# Patient Record
Sex: Female | Born: 1960 | Race: White | Hispanic: No | Marital: Married | Smoking: Never smoker
Health system: Southern US, Community
[De-identification: ages and names within clinical notes are randomized; demographics above are authoritative.]

---

## 2001-07-08 ENCOUNTER — Other Ambulatory Visit: Admission: RE | Admit: 2001-07-08 | Discharge: 2001-07-08 | Payer: Self-pay | Admitting: Obstetrics and Gynecology

## 2002-07-10 ENCOUNTER — Other Ambulatory Visit: Admission: RE | Admit: 2002-07-10 | Discharge: 2002-07-10 | Payer: Self-pay | Admitting: Obstetrics and Gynecology

## 2003-08-01 ENCOUNTER — Other Ambulatory Visit: Admission: RE | Admit: 2003-08-01 | Discharge: 2003-08-01 | Payer: Self-pay | Admitting: Obstetrics and Gynecology

## 2006-11-09 ENCOUNTER — Ambulatory Visit (HOSPITAL_COMMUNITY): Admission: RE | Admit: 2006-11-09 | Discharge: 2006-11-09 | Payer: Self-pay | Admitting: Obstetrics and Gynecology

## 2010-10-14 ENCOUNTER — Other Ambulatory Visit: Payer: Self-pay | Admitting: Obstetrics and Gynecology

## 2010-12-03 ENCOUNTER — Other Ambulatory Visit: Payer: Self-pay | Admitting: Obstetrics and Gynecology

## 2020-03-02 ENCOUNTER — Other Ambulatory Visit: Payer: Self-pay

## 2020-03-02 ENCOUNTER — Emergency Department (HOSPITAL_COMMUNITY): Payer: 59

## 2020-03-02 ENCOUNTER — Encounter (HOSPITAL_COMMUNITY): Payer: Self-pay

## 2020-03-02 DIAGNOSIS — A419 Sepsis, unspecified organism: Secondary | ICD-10-CM | POA: Diagnosis not present

## 2020-03-02 DIAGNOSIS — E871 Hypo-osmolality and hyponatremia: Secondary | ICD-10-CM | POA: Diagnosis present

## 2020-03-02 DIAGNOSIS — K5732 Diverticulitis of large intestine without perforation or abscess without bleeding: Secondary | ICD-10-CM | POA: Diagnosis present

## 2020-03-02 DIAGNOSIS — K8020 Calculus of gallbladder without cholecystitis without obstruction: Secondary | ICD-10-CM | POA: Diagnosis present

## 2020-03-02 DIAGNOSIS — K2100 Gastro-esophageal reflux disease with esophagitis, without bleeding: Secondary | ICD-10-CM | POA: Diagnosis present

## 2020-03-02 DIAGNOSIS — Z79899 Other long term (current) drug therapy: Secondary | ICD-10-CM

## 2020-03-02 DIAGNOSIS — J9811 Atelectasis: Secondary | ICD-10-CM | POA: Diagnosis present

## 2020-03-02 DIAGNOSIS — Z20822 Contact with and (suspected) exposure to covid-19: Secondary | ICD-10-CM | POA: Diagnosis present

## 2020-03-02 DIAGNOSIS — R630 Anorexia: Secondary | ICD-10-CM | POA: Diagnosis present

## 2020-03-02 LAB — CBC
HCT: 39 % (ref 36.0–46.0)
Hemoglobin: 12.7 g/dL (ref 12.0–15.0)
MCH: 28.3 pg (ref 26.0–34.0)
MCHC: 32.6 g/dL (ref 30.0–36.0)
MCV: 87.1 fL (ref 80.0–100.0)
Platelets: 256 10*3/uL (ref 150–400)
RBC: 4.48 MIL/uL (ref 3.87–5.11)
RDW: 12.8 % (ref 11.5–15.5)
WBC: 17.9 10*3/uL — ABNORMAL HIGH (ref 4.0–10.5)
nRBC: 0 % (ref 0.0–0.2)

## 2020-03-02 LAB — BASIC METABOLIC PANEL
Anion gap: 9 (ref 5–15)
BUN: 16 mg/dL (ref 6–20)
CO2: 23 mmol/L (ref 22–32)
Calcium: 9.2 mg/dL (ref 8.9–10.3)
Chloride: 102 mmol/L (ref 98–111)
Creatinine, Ser: 0.78 mg/dL (ref 0.44–1.00)
GFR calc Af Amer: >60 mL/min (ref >60)
GFR calc non Af Amer: >60 mL/min (ref >60)
Glucose, Bld: 121 mg/dL — ABNORMAL HIGH (ref 70–99)
Potassium: 4 mmol/L (ref 3.5–5.1)
Sodium: 134 mmol/L — ABNORMAL LOW (ref 135–145)

## 2020-03-02 LAB — I-STAT BETA HCG BLOOD, ED (MC, WL, AP ONLY): I-stat hCG, quantitative: 22.6 m[IU]/mL — ABNORMAL HIGH (ref <5)

## 2020-03-02 NOTE — ED Triage Notes (Signed)
Pt reports R sided flank pain that started today. Pt states she had generalized abdominal pain on Wednesday and went to UC where they ruled out gall stones and any other issues.

## 2020-03-03 ENCOUNTER — Inpatient Hospital Stay (HOSPITAL_COMMUNITY): Payer: 59

## 2020-03-03 ENCOUNTER — Encounter (HOSPITAL_COMMUNITY): Payer: Self-pay | Admitting: Family Medicine

## 2020-03-03 ENCOUNTER — Inpatient Hospital Stay (HOSPITAL_COMMUNITY)
Admission: EM | Admit: 2020-03-03 | Discharge: 2020-03-05 | DRG: 872 | Disposition: A | Discharge status: Home / Self Care | Payer: 59 | Attending: Internal Medicine | Admitting: Internal Medicine

## 2020-03-03 DIAGNOSIS — E871 Hypo-osmolality and hyponatremia: Secondary | ICD-10-CM | POA: Diagnosis present

## 2020-03-03 DIAGNOSIS — K8020 Calculus of gallbladder without cholecystitis without obstruction: Secondary | ICD-10-CM | POA: Diagnosis present

## 2020-03-03 DIAGNOSIS — K2100 Gastro-esophageal reflux disease with esophagitis, without bleeding: Secondary | ICD-10-CM | POA: Diagnosis present

## 2020-03-03 DIAGNOSIS — J9811 Atelectasis: Secondary | ICD-10-CM | POA: Diagnosis present

## 2020-03-03 DIAGNOSIS — R935 Abnormal findings on diagnostic imaging of other abdominal regions, including retroperitoneum: Secondary | ICD-10-CM

## 2020-03-03 DIAGNOSIS — A419 Sepsis, unspecified organism: Secondary | ICD-10-CM | POA: Diagnosis present

## 2020-03-03 DIAGNOSIS — K5792 Diverticulitis of intestine, part unspecified, without perforation or abscess without bleeding: Secondary | ICD-10-CM | POA: Diagnosis present

## 2020-03-03 DIAGNOSIS — Z20822 Contact with and (suspected) exposure to covid-19: Secondary | ICD-10-CM | POA: Diagnosis present

## 2020-03-03 DIAGNOSIS — K5732 Diverticulitis of large intestine without perforation or abscess without bleeding: Secondary | ICD-10-CM

## 2020-03-03 DIAGNOSIS — Z79899 Other long term (current) drug therapy: Secondary | ICD-10-CM | POA: Diagnosis not present

## 2020-03-03 DIAGNOSIS — R630 Anorexia: Secondary | ICD-10-CM | POA: Diagnosis present

## 2020-03-03 DIAGNOSIS — R1011 Right upper quadrant pain: Secondary | ICD-10-CM

## 2020-03-03 LAB — HIV ANTIBODY (ROUTINE TESTING W REFLEX): HIV Screen 4th Generation wRfx: NONREACTIVE

## 2020-03-03 LAB — URINALYSIS, ROUTINE W REFLEX MICROSCOPIC
Bilirubin Urine: NEGATIVE
Glucose, UA: NEGATIVE mg/dL
Ketones, ur: 5 mg/dL — AB
Nitrite: NEGATIVE
Protein, ur: NEGATIVE mg/dL
Specific Gravity, Urine: 1.028 (ref 1.005–1.030)
pH: 5 (ref 5.0–8.0)

## 2020-03-03 LAB — CBC
HCT: 36.8 % (ref 36.0–46.0)
Hemoglobin: 12.1 g/dL (ref 12.0–15.0)
MCH: 28.9 pg (ref 26.0–34.0)
MCHC: 32.9 g/dL (ref 30.0–36.0)
MCV: 87.8 fL (ref 80.0–100.0)
Platelets: 254 10*3/uL (ref 150–400)
RBC: 4.19 MIL/uL (ref 3.87–5.11)
RDW: 13.1 % (ref 11.5–15.5)
WBC: 18 10*3/uL — ABNORMAL HIGH (ref 4.0–10.5)
nRBC: 0 % (ref 0.0–0.2)

## 2020-03-03 LAB — HEPATIC FUNCTION PANEL
ALT: 22 U/L (ref 0–44)
AST: 19 U/L (ref 15–41)
Albumin: 4 g/dL (ref 3.5–5.0)
Alkaline Phosphatase: 91 U/L (ref 38–126)
Bilirubin, Direct: 0.4 mg/dL — ABNORMAL HIGH (ref 0.0–0.2)
Indirect Bilirubin: 0.9 mg/dL (ref 0.3–0.9)
Total Bilirubin: 1.3 mg/dL — ABNORMAL HIGH (ref 0.3–1.2)
Total Protein: 7.9 g/dL (ref 6.5–8.1)

## 2020-03-03 LAB — LIPASE, BLOOD: Lipase: 26 U/L (ref 11–51)

## 2020-03-03 LAB — HCG, QUANTITATIVE, PREGNANCY: hCG, Beta Chain, Quant, S: 4 m[IU]/mL (ref <5)

## 2020-03-03 LAB — MAGNESIUM: Magnesium: 1.7 mg/dL (ref 1.7–2.4)

## 2020-03-03 LAB — SARS CORONAVIRUS 2 BY RT PCR (HOSPITAL ORDER, PERFORMED IN ~~LOC~~ HOSPITAL LAB): SARS Coronavirus 2: NEGATIVE

## 2020-03-03 LAB — LACTIC ACID, PLASMA: Lactic Acid, Venous: 0.7 mmol/L (ref 0.5–1.9)

## 2020-03-03 MED ORDER — ENOXAPARIN SODIUM 40 MG/0.4ML ~~LOC~~ SOLN
40.0000 mg | SUBCUTANEOUS | Status: DC
  Administered 2020-03-03 – 2020-03-04 (×2): 40 mg via SUBCUTANEOUS
  Filled 2020-03-03 (×2): qty 0.4

## 2020-03-03 MED ORDER — METRONIDAZOLE IN NACL 5-0.79 MG/ML-% IV SOLN
500.0000 mg | Freq: Three times a day (TID) | INTRAVENOUS | Status: DC
  Administered 2020-03-03 – 2020-03-05 (×7): 500 mg via INTRAVENOUS
  Filled 2020-03-03 (×7): qty 100

## 2020-03-03 MED ORDER — ACETAMINOPHEN 325 MG PO TABS
650.0000 mg | ORAL_TABLET | Freq: Four times a day (QID) | ORAL | Status: DC | PRN
  Administered 2020-03-03 – 2020-03-04 (×3): 650 mg via ORAL
  Filled 2020-03-03 (×3): qty 2

## 2020-03-03 MED ORDER — HYDROCODONE-ACETAMINOPHEN 5-325 MG PO TABS
1.0000 | ORAL_TABLET | ORAL | Status: DC | PRN
  Administered 2020-03-03: 1 via ORAL
  Administered 2020-03-03: 16:00:00 2 via ORAL
  Filled 2020-03-03: qty 2
  Filled 2020-03-03: qty 1
  Filled 2020-03-03: qty 2

## 2020-03-03 MED ORDER — SODIUM CHLORIDE 0.9 % IV SOLN: INTRAVENOUS | Status: AC

## 2020-03-03 MED ORDER — LORATADINE 10 MG PO TABS
10.0000 mg | ORAL_TABLET | Freq: Every day | ORAL | Status: DC
  Administered 2020-03-03 – 2020-03-05 (×3): 10 mg via ORAL
  Filled 2020-03-03 (×3): qty 1

## 2020-03-03 MED ORDER — PANTOPRAZOLE SODIUM 40 MG PO TBEC
40.0000 mg | DELAYED_RELEASE_TABLET | Freq: Two times a day (BID) | ORAL | Status: DC
  Administered 2020-03-03 – 2020-03-05 (×5): 40 mg via ORAL
  Filled 2020-03-03 (×5): qty 1

## 2020-03-03 MED ORDER — ACETAMINOPHEN 650 MG RE SUPP: 650.0000 mg | Freq: Four times a day (QID) | RECTAL | Status: DC | PRN

## 2020-03-03 MED ORDER — HYDROMORPHONE HCL 1 MG/ML IJ SOLN
0.5000 mg | Freq: Once | INTRAMUSCULAR | Status: AC
  Administered 2020-03-03: 0.5 mg via INTRAVENOUS
  Filled 2020-03-03: qty 1

## 2020-03-03 MED ORDER — MORPHINE SULFATE (PF) 4 MG/ML IV SOLN
3.0000 mg | INTRAVENOUS | Status: DC | PRN
  Administered 2020-03-03: 3 mg via INTRAVENOUS
  Filled 2020-03-03: qty 1

## 2020-03-03 MED ORDER — ONDANSETRON HCL 4 MG/2ML IJ SOLN: 4.0000 mg | Freq: Four times a day (QID) | INTRAMUSCULAR | Status: DC | PRN

## 2020-03-03 MED ORDER — OXYCODONE HCL 5 MG PO TABS
5.0000 mg | ORAL_TABLET | ORAL | Status: DC | PRN
  Administered 2020-03-04: 5 mg via ORAL
  Filled 2020-03-03 (×2): qty 1

## 2020-03-03 MED ORDER — LACTATED RINGERS IV BOLUS
1000.0000 mL | Freq: Once | INTRAVENOUS | Status: AC
  Administered 2020-03-03: 1000 mL via INTRAVENOUS

## 2020-03-03 MED ORDER — SODIUM CHLORIDE 0.9 % IV SOLN
2.0000 g | Freq: Every day | INTRAVENOUS | Status: DC
  Administered 2020-03-03 – 2020-03-05 (×3): 2 g via INTRAVENOUS
  Filled 2020-03-03 (×2): qty 2

## 2020-03-03 MED ORDER — LORATADINE-PSEUDOEPHEDRINE ER 10-240 MG PO TB24: 1.0000 | ORAL_TABLET | Freq: Every day | ORAL | Status: DC

## 2020-03-03 MED ORDER — PSEUDOEPHEDRINE HCL ER 120 MG PO TB12
120.0000 mg | ORAL_TABLET | Freq: Two times a day (BID) | ORAL | Status: DC
  Administered 2020-03-03 – 2020-03-05 (×5): 120 mg via ORAL
  Filled 2020-03-03 (×5): qty 1

## 2020-03-03 MED ORDER — ONDANSETRON HCL 4 MG/2ML IJ SOLN
4.0000 mg | Freq: Once | INTRAMUSCULAR | Status: AC
  Administered 2020-03-03: 4 mg via INTRAVENOUS
  Filled 2020-03-03: qty 2

## 2020-03-03 MED ORDER — METRONIDAZOLE IN NACL 5-0.79 MG/ML-% IV SOLN
500.0000 mg | Freq: Once | INTRAVENOUS | Status: DC
  Filled 2020-03-03: qty 100

## 2020-03-03 MED ORDER — CIPROFLOXACIN IN D5W 400 MG/200ML IV SOLN
400.0000 mg | Freq: Once | INTRAVENOUS | Status: DC
  Filled 2020-03-03: qty 200

## 2020-03-03 MED ORDER — ONDANSETRON HCL 4 MG PO TABS: 4.0000 mg | ORAL_TABLET | Freq: Four times a day (QID) | ORAL | Status: DC | PRN

## 2020-03-03 NOTE — H&P (Signed)
History and Physical    Denise Lloyd ZOX:[id number] DOB: June 26, 1961 DOA: 03/03/2020  PCP: Patient, No Pcp Per   Patient coming from: Home   Chief Complaint: Abdominal pain, loss of appetite   HPI: Denise Lloyd is a 58 y.o. female with medical history significant for GERD with esophagitis, presenting to the emergency department with abdominal pain and loss of appetite.  Patient reports that she developed generalized abdominal discomfort couple days ago, was seen at an urgent care where she reports having unremarkable urinalysis, was told that her blood work looked okay and that she did not have any gallstones, and went home with Norco, ciprofloxacin, and Flagyl.  She went on to have more severe pain which was localizing towards the right side of her abdomen, becoming severe, not controlled with Norco, and ultimately prompting her presentation to the ED.  She has not noticed any fevers or chills, reports loss of appetite but denies vomiting, denies diarrhea, and denies melena or hematochezia.  She has never experienced this before.  She has tried to eat a little bit while taking her antibiotics at home and has not noticed any worsening in her symptoms after eating.  She denies any shortness of breath or cough.  She denies chest pain.  ED Course: Upon arrival to the ED, patient is found to be afebrile, saturating 90s on room air, heart rate 100, and blood pressure 130/90.  Chemistry panel is unremarkable and CBC notable for leukocytosis to 17,900.  CT of the abdomen and pelvis is concerning for acute diverticulitis at the hepatic flexure without fluid collection or free air.  Also noted on the CT is some gallbladder wall thickening and atelectasis versus early infection at the right base.  Patient was given a liter of IV fluids, Dilaudid, and started on antibiotics in the ED.  LFTs, lipase, urinalysis, and Covid screening test are all pending at this time.  Review of Systems:  All other systems reviewed  and apart from HPI, are negative.  History reviewed. No pertinent past medical history.  History reviewed. No pertinent surgical history.  Social History:   has no history on file for tobacco use, alcohol use, and drug use.  No Known Allergies  History reviewed. No pertinent family history.   Prior to Admission medications   Medication Sig Start Date End Date Taking? Authorizing Provider  ciprofloxacin (CIPRO) 500 MG tablet Take 500 mg by mouth 2 (two) times daily. 03/01/20  Yes [provider]  glucosamine-chondroitin 500-400 MG tablet Take 2 tablets by mouth daily.    Yes [provider]  HYDROcodone-acetaminophen (NORCO/VICODIN) 5-325 MG tablet Take 1 tablet by mouth every 4 (four) hours as needed for moderate pain or severe pain.  02/29/20  Yes [provider]  ibuprofen (ADVIL) 200 MG tablet Take 600 mg by mouth every 6 (six) hours as needed for fever, headache, mild pain, moderate pain or cramping.   Yes [provider]  loratadine-pseudoephedrine (CLARITIN-D 24-HOUR) 10-240 MG 24 hr tablet Take 1 tablet by mouth daily.   Yes [provider]  metroNIDAZOLE (FLAGYL) 500 MG tablet Take 500 mg by mouth 3 (three) times daily. 03/01/20  Yes [provider]  Multiple Vitamin (MULTIVITAMIN WITH MINERALS) TABS tablet Take 1 tablet by mouth daily.   Yes [provider]  omeprazole (PRILOSEC) 40 MG capsule Take 40 mg by mouth 2 (two) times daily. 02/16/20  Yes [provider]  sodium chloride (MURO 128) 5 % ophthalmic ointment Place 1  application into both eyes at bedtime.   Yes [provider]    Physical Exam: Vitals:   03/02/20 2100 03/03/20 0307  BP: 129/88 132/88  Pulse: 100 96  Resp: 20 (!) 22  Temp: 97.9 F (36.6 C)   TempSrc: Oral   SpO2: 91% 100%    Constitutional: NAD, calm  Eyes: PERTLA, lids and conjunctivae normal ENMT: Mucous membranes are moist. Posterior pharynx clear of any exudate or  lesions.   Neck: normal, supple, no masses, no thyromegaly Respiratory:  no wheezing, no crackles. No accessory muscle use.  Cardiovascular: S1 & S2 heard, regular rate and rhythm. No extremity edema.   Abdomen: No distension, tender in right abdomen, no rebound pain or guarding. Bowel sounds active.  Musculoskeletal: no clubbing / cyanosis. No joint deformity upper and lower extremities.   Skin: no significant rashes, lesions, ulcers. Warm, dry, well-perfused. Neurologic: CN 2-12 grossly intact. Sensation intact. Moving all extremities.  Psychiatric: Alert and oriented to person, place, and situation. Very pleasant and cooperative.    Labs and Imaging on Admission: I have personally reviewed following labs and imaging studies  CBC: Recent Labs  Lab 03/02/20 2122  WBC 17.9*  HGB 12.7  HCT 39.0  MCV 87.1  PLT 256   Basic Metabolic Panel: Recent Labs  Lab 03/02/20 2122  NA 134*  K 4.0  CL 102  CO2 23  GLUCOSE 121*  BUN 16  CREATININE 0.78  CALCIUM 9.2   GFR: CrCl cannot be calculated (Unknown ideal weight.). Liver Function Tests: No results for input(s): AST, ALT, ALKPHOS, BILITOT, PROT, ALBUMIN in the last 168 hours. No results for input(s): LIPASE, AMYLASE in the last 168 hours. No results for input(s): AMMONIA in the last 168 hours. Coagulation Profile: No results for input(s): INR, PROTIME in the last 168 hours. Cardiac Enzymes: No results for input(s): CKTOTAL, CKMB, CKMBINDEX, TROPONINI in the last 168 hours. BNP (last 3 results) No results for input(s): PROBNP in the last 8760 hours. HbA1C: No results for input(s): HGBA1C in the last 72 hours. CBG: No results for input(s): GLUCAP in the last 168 hours. Lipid Profile: No results for input(s): CHOL, HDL, LDLCALC, TRIG, CHOLHDL, LDLDIRECT in the last 72 hours. Thyroid Function Tests: No results for input(s): TSH, T4TOTAL, FREET4, T3FREE, THYROIDAB in the last 72 hours. Anemia Panel: No results for input(s):  VITAMINB12, FOLATE, FERRITIN, TIBC, IRON, RETICCTPCT in the last 72 hours. Urine analysis: No results found for: COLORURINE, APPEARANCEUR, LABSPEC, PHURINE, GLUCOSEU, HGBUR, BILIRUBINUR, KETONESUR, PROTEINUR, UROBILINOGEN, NITRITE, LEUKOCYTESUR Sepsis Labs: @LABRCNTIP (procalcitonin:4,lacticidven:4) )No results found for this or any previous visit (from the past 240 hour(s)).   Radiological Exams on Admission: CT Renal Stone Study  Result Date: 03/02/2020 CLINICAL DATA:  Right-sided flank pain EXAM: CT ABDOMEN AND PELVIS WITHOUT CONTRAST TECHNIQUE: Multidetector CT imaging of the abdomen and pelvis was performed following the standard protocol without IV contrast. COMPARISON:  None. FINDINGS: Lower chest: The visualized heart size within normal limits. No pericardial fluid/thickening. Coronary artery calcifications are seen. There is rounded patchy airspace consolidation seen at the posterior right lung base with air bronchograms. There is streaky atelectasis at the left lung base. . Hepatobiliary: Although limited due to the lack of intravenous contrast, normal in appearance without gross focal abnormality. There appears to be diffuse fat stranding changes seen around the gallbladder with layering small stones/sludge. There is also mild diffuse gallbladder wall thickening. Pancreas:  Unremarkable.  No surrounding inflammatory changes. Spleen: Normal in size. Although limited due to  the lack of intravenous contrast, normal in appearance. Adrenals/Urinary Tract: Both adrenal glands appear normal. The kidneys and collecting system appear normal without evidence of urinary tract calculus or hydronephrosis. Bladder is unremarkable. Stomach/Bowel: The stomach and small bowel are normal in appearance. There appears to be diffuse wall thickening seen at the hepatic flexure with small scattered colonic diverticula surrounding mesenteric fat stranding changes and tiny small lymph nodes present. No pericolonic  loculated fluid collections or free air. The remainder of the colon is unremarkable. Vascular/Lymphatic: There are no enlarged abdominal or pelvic lymph nodes. Scattered minimal aortic atherosclerosis is seen. Reproductive: The uterus and adnexa are unremarkable. Other: There is a mild small amount of free fluid seen in the cul-de-sac. Musculoskeletal: No acute or significant osseous findings. Moderate bilateral hip osteoarthritis is seen with superior joint space loss and cystic changes. IMPRESSION: Findings suggestive of hepatic flexure diverticulitis. No surrounding loculated fluid collections or free air. Wall thickening and inflammatory changes around the gallbladder which could be due to acute cholecystitis. If further evaluation is required would recommend dedicated right upper quadrant ultrasound. Rounded patchy airspace opacity at the right lung base which could be due to atelectasis and/or early infectious etiology. Aortic Atherosclerosis (ICD10-I70.0). Electronically Signed   By: Jonna Clark M.D.   On: 03/02/2020 21:55    Assessment/Plan  1. Acute diverticulitis, sepsis POA  - Presents with a couple days of abdominal pain and loss of appetite, was seen at urgent care and started on ciprofloxacin and Flagyl but had worsening pain and presented to ED where she is slightly tachycardic with leukocytosis and CT findings consistent with acute diverticulitis  - There is no abscess or perforation  - Culture blood, check lactate, continue IVF and pain-control, start Rocephin and Flagyl     DVT prophylaxis: Lovenox  Code Status: Full  Family Communication: Husband updated at bedside  Disposition Plan:  Patient is from: Home  Anticipated d/c is to: Home  Anticipated d/c date is: 03/05/20 Patient currently: Pending resolution of sepsis, pain-control  Consults called: None  Admission status: Inpatient    Briscoe Deutscher, MD [facility]  03/03/2020, 4:40 AM

## 2020-03-03 NOTE — ED Provider Notes (Signed)
[facility]-EMERGENCY DEPT Provider Note   CSN: [id number] Arrival date & time: 03/02/20  2035   History Chief Complaint  Patient presents with  . Flank Pain    Denise Lloyd is a 59 y.o. female.  The history is provided by the patient.  Flank Pain  She has history of GERD and comes in because of right-sided abdominal pain.  She started having some epigastric pain which radiated to both sides of the abdomen.  This was 2 days ago.  She went to an urgent care center where she had some labs drawn and was given prescription for ciprofloxacin, metronidazole, and hydrocodone-acetaminophen.  She started taking those medications yesterday.  Today, she started having severe pain in the right upper abdomen without radiation.  There is no associated nausea or vomiting.  She denies fever or chills.  She has had anorexia but has tried to force herself to eat.  Nothing made the pain better, nothing made it worse.  She did take a dose of hydrocodone-acetaminophen without relief of pain.  She rates pain at 8/10.  History reviewed. No pertinent past medical history.  There are no problems to display for this patient.   History reviewed. No pertinent surgical history.   OB History   No obstetric history on file.     History reviewed. No pertinent family history.  Social History   Tobacco Use  . Smoking status: Not on file  Substance Use Topics  . Alcohol use: Not on file  . Drug use: Not on file    Home Medications Prior to Admission medications   Not on File    Allergies    Patient has no known allergies.  Review of Systems   Review of Systems  Genitourinary: Positive for flank pain.  All other systems reviewed and are negative.   Physical Exam Updated Vital Signs BP 132/88   Pulse 96   Temp 97.9 F (36.6 C) (Oral)   Resp (!) 22   SpO2 100%   Physical Exam Vitals and nursing note reviewed.   59 year old female, resting comfortably and in no acute  distress. Vital signs are significant for mildly elevated respiratory rate. Oxygen saturation is 100%, which is normal. Head is normocephalic and atraumatic. PERRLA, EOMI. Oropharynx is clear. Neck is nontender and supple without adenopathy or JVD. Back is nontender and there is no CVA tenderness. Lungs are clear without rales, wheezes, or rhonchi. Chest is nontender. Heart has regular rate and rhythm without murmur. Abdomen is soft, flat, with marked tenderness in the right mid and upper abdomen.  There is no rebound or guarding.  There are no masses or hepatosplenomegaly and peristalsis is hypoactive. Extremities have no cyanosis or edema, full range of motion is present. Skin is warm and dry without rash. Neurologic: Mental status is normal, cranial nerves are intact, there are no motor or sensory deficits.  ED Results / Procedures / Treatments   Labs (all labs ordered are listed, but only abnormal results are displayed) Labs Reviewed  BASIC METABOLIC PANEL - Abnormal; Notable for the following components:      Result Value   Sodium 134 (*)    Glucose, Bld 121 (*)    All other components within normal limits  CBC - Abnormal; Notable for the following components:   WBC 17.9 (*)    All other components within normal limits  I-STAT BETA HCG BLOOD, ED ([facility], [facility], [facility] ONLY) - Abnormal; Notable for the following components:  I-stat hCG, quantitative 22.6 (*)    All other components within normal limits  URINALYSIS, ROUTINE W REFLEX MICROSCOPIC   Radiology CT Renal Stone Study  Result Date: 03/02/2020 CLINICAL DATA:  Right-sided flank pain EXAM: CT ABDOMEN AND PELVIS WITHOUT CONTRAST TECHNIQUE: Multidetector CT imaging of the abdomen and pelvis was performed following the standard protocol without IV contrast. COMPARISON:  None. FINDINGS: Lower chest: The visualized heart size within normal limits. No pericardial fluid/thickening. Coronary artery calcifications are seen. There is rounded  patchy airspace consolidation seen at the posterior right lung base with air bronchograms. There is streaky atelectasis at the left lung base. . Hepatobiliary: Although limited due to the lack of intravenous contrast, normal in appearance without gross focal abnormality. There appears to be diffuse fat stranding changes seen around the gallbladder with layering small stones/sludge. There is also mild diffuse gallbladder wall thickening. Pancreas:  Unremarkable.  No surrounding inflammatory changes. Spleen: Normal in size. Although limited due to the lack of intravenous contrast, normal in appearance. Adrenals/Urinary Tract: Both adrenal glands appear normal. The kidneys and collecting system appear normal without evidence of urinary tract calculus or hydronephrosis. Bladder is unremarkable. Stomach/Bowel: The stomach and small bowel are normal in appearance. There appears to be diffuse wall thickening seen at the hepatic flexure with small scattered colonic diverticula surrounding mesenteric fat stranding changes and tiny small lymph nodes present. No pericolonic loculated fluid collections or free air. The remainder of the colon is unremarkable. Vascular/Lymphatic: There are no enlarged abdominal or pelvic lymph nodes. Scattered minimal aortic atherosclerosis is seen. Reproductive: The uterus and adnexa are unremarkable. Other: There is a mild small amount of free fluid seen in the cul-de-sac. Musculoskeletal: No acute or significant osseous findings. Moderate bilateral hip osteoarthritis is seen with superior joint space loss and cystic changes. IMPRESSION: Findings suggestive of hepatic flexure diverticulitis. No surrounding loculated fluid collections or free air. Wall thickening and inflammatory changes around the gallbladder which could be due to acute cholecystitis. If further evaluation is required would recommend dedicated right upper quadrant ultrasound. Rounded patchy airspace opacity at the right lung  base which could be due to atelectasis and/or early infectious etiology. Aortic Atherosclerosis (ICD10-I70.0). Electronically Signed   By: Jonna Clark M.D.   On: 03/02/2020 21:55    Procedures Procedures   Medications Ordered in ED Medications  ondansetron (ZOFRAN) injection 4 mg (has no administration in time range)  HYDROmorphone (DILAUDID) injection 0.5 mg (has no administration in time range)  lactated ringers bolus 1,000 mL (has no administration in time range)  cefTRIAXone (ROCEPHIN) 2 g in sodium chloride 0.9 % 100 mL IVPB (has no administration in time range)  metroNIDAZOLE (FLAGYL) IVPB 500 mg (has no administration in time range)    ED Course  I have reviewed the triage vital signs and the nursing notes.  Pertinent labs & imaging results that were available during my care of the patient were reviewed by me and considered in my medical decision making (see chart for details).  MDM Rules/Calculators/A&P Right upper quadrant abdominal pain.  Differential is broad and does include conditions with high morbidity and mortality.  Differential diagnosis includes diverticulitis, cholecystitis, renal colic, pancreatitis.  CT scan of abdomen and pelvis obtained in triage showed evidence of diverticulitis involving the hepatic flexure.  Since she has been getting worse in spite of outpatient antibiotics, it was felt appropriate to admit her.  Labs do show elevated WBC of 17.9.  Metabolic panel shows mild hyponatremia, not felt to  be clinically significant.  Will need to check hepatic function panel and lipase.  She is started on intravenous ciprofloxacin and metronidazole.  She is given hydromorphone for pain.  Case is discussed with Dr. Antionette Char of [facility], who agrees to admit the patient.  Old records are reviewed showing outpatient management of GERD, no prior abdominal imaging.  She did have colonoscopy done on 07/09/2017, but I am unable to see the report.  Office visit following  colonoscopy does mention presence of diverticulosis.  Final Clinical Impression(s) / ED Diagnoses Final diagnoses:  Diverticulitis of colon  Hyponatremia    Rx / DC Orders ED Discharge Orders    None       Dione Booze, MD 03/03/20 0345

## 2020-03-03 NOTE — ED Notes (Addendum)
Pt is not vaccinated.  °

## 2020-03-03 NOTE — Progress Notes (Signed)
PROGRESS NOTE    Denise Lloyd  MBW:[id number] DOB: 1961-03-22 DOA: 03/03/2020 PCP: Patient, No Pcp Per (Confirm with patient/family/NH records and if not entered, this HAS to be entered at [facility] point of entry. "No PCP" if truly none.)   Chief Complaint  Patient presents with  . Flank Pain    Brief Narrative:  Denise Lloyd is Nakai Yard 59 y.o. female with medical history significant for GERD with esophagitis, presenting to the emergency department with abdominal pain and loss of appetite.  Patient reports that she developed generalized abdominal discomfort couple days ago, was seen at an urgent care where she reports having unremarkable urinalysis, was told that her blood work looked okay and that she did not have any gallstones, and went home with Norco, ciprofloxacin, and Flagyl.  She went on to have more severe pain which was localizing towards the right side of her abdomen, becoming severe, not controlled with Norco, and ultimately prompting her presentation to the ED.  She has not noticed any fevers or chills, reports loss of appetite but denies vomiting, denies diarrhea, and denies melena or hematochezia.  She has never experienced this before.  She has tried to eat Denise Lloyd little bit while taking her antibiotics at home and has not noticed any worsening in her symptoms after eating.  She denies any shortness of breath or cough.  She denies chest pain.  ED Course: Upon arrival to the ED, patient is found to be afebrile, saturating 90s on room air, heart rate 100, and blood pressure 130/90.  Chemistry panel is unremarkable and CBC notable for leukocytosis to 17,900.  CT of the abdomen and pelvis is concerning for acute diverticulitis at the hepatic flexure without fluid collection or free air.  Also noted on the CT is some gallbladder wall thickening and atelectasis versus early infection at the right base.  Patient was given Anaiz Qazi liter of IV fluids, Dilaudid, and started on antibiotics in the ED.  LFTs, lipase,  urinalysis, and Covid screening test are all pending at this time.  Assessment & Plan:   Principal Problem:   Acute diverticulitis Active Problems:   Sepsis ([place])  1. Acute diverticulitis, sepsis POA  - Met sepsis criteria on admission with tachycardia and leukocytosis with evidence of diverticulitis - CT with hepatic flexure diverticulitis and wall thickening and inflammatory changes around the gallbladder, possibly 2/2 cholecystitis and patchy airspace opacity at R lung base - RUQ [place] with cholelithiasis and mild gallbladder wall thickening.  No additional sonographic evidence of acute cholecystitis. - There is no abscess or perforation  - Continue ceftriaxone/flagyl  # Abnormal CXR: bilateral airspace disease, possible atelectasis or pneumonia with L base opacity   DVT prophylaxis: lovenox Code Status: full  Family Communication: none at bedside Disposition:   Status is: Inpatient  Remains inpatient appropriate because:Inpatient level of care appropriate due to severity of illness   Dispo: The patient is from: Home              Anticipated d/c is to: Home              Anticipated d/c date is: 3 days              Patient currently is not medically stable to d/c.   Consultants:   none  Procedures:  none  Antimicrobials: Anti-infectives (From admission, onward)   Start     Dose/Rate Route Frequency Ordered Stop   03/03/20 0400  cefTRIAXone (ROCEPHIN) 2 g in sodium chloride  0.9 % 100 mL IVPB     Discontinue     2 g 200 mL/hr over 30 Minutes Intravenous Daily 03/03/20 0341     03/03/20 0400  metroNIDAZOLE (FLAGYL) IVPB 500 mg     Discontinue     500 mg 100 mL/hr over 60 Minutes Intravenous Every 8 hours 03/03/20 0341     03/03/20 0345  ciprofloxacin (CIPRO) IVPB 400 mg  Status:  Discontinued        400 mg 200 mL/hr over 60 Minutes Intravenous  Once 03/03/20 0331 03/03/20 0341   03/03/20 0345  metroNIDAZOLE (FLAGYL) IVPB 500 mg  Status:  Discontinued        500  mg 100 mL/hr over 60 Minutes Intravenous  Once 03/03/20 0331 03/03/20 0341         Subjective: No new complaints  Objective: Vitals:   03/03/20 0700 03/03/20 0730 03/03/20 0800 03/03/20 0824  BP: 116/76 118/76 130/73 140/88  Pulse: 82 77 75 83  Resp:  20  20  Temp:  98.1 F (36.7 C)  (!) 97.5 F (36.4 C)  TempSrc:  Oral Oral Oral  SpO2: 92% 94% 92% 93%  Weight:    102.4 kg  Height:    5' 7"  (1.702 m)   No intake or output data in the 24 hours ending 03/03/20 1232 Filed Weights   03/03/20 0824  Weight: 102.4 kg    Examination:  General exam: Appears calm and comfortable  Respiratory system: Clear to auscultation. Respiratory effort normal. Cardiovascular system: S1 & S2 heard, RRR.  Gastrointestinal system: Abdomen is nondistended, soft and nontender Central nervous system: Alert and oriented. No focal neurological deficits. Extremities: no LEE Skin: No rashes, lesions or ulcers Psychiatry: Judgement and insight appear normal. Mood & affect appropriate.     Data Reviewed: I have personally reviewed following labs and imaging studies  CBC: Recent Labs  Lab 03/02/20 2122 03/03/20 0645  WBC 17.9* 18.0*  HGB 12.7 12.1  HCT 39.0 36.8  MCV 87.1 87.8  PLT 256 099    Basic Metabolic Panel: Recent Labs  Lab 03/02/20 2122 03/03/20 0448  NA 134*  --   K 4.0  --   CL 102  --   CO2 23  --   GLUCOSE 121*  --   BUN 16  --   CREATININE 0.78  --   CALCIUM 9.2  --   MG  --  1.7    GFR: Estimated Creatinine Clearance: 93.1 mL/min (by C-G formula based on SCr of 0.78 mg/dL).  Liver Function Tests: Recent Labs  Lab 03/03/20 0338  AST 19  ALT 22  ALKPHOS 91  BILITOT 1.3*  PROT 7.9  ALBUMIN 4.0    CBG: No results for input(s): GLUCAP in the last 168 hours.   Recent Results (from the past 240 hour(s))  SARS Coronavirus 2 by RT PCR (hospital order, performed in [facility] hospital lab) Nasopharyngeal Nasopharyngeal Swab     Status: None    Collection Time: 03/03/20  3:29 AM   Specimen: Nasopharyngeal Swab  Result Value Ref Range Status   SARS Coronavirus 2 NEGATIVE NEGATIVE Final    Comment: (NOTE) SARS-CoV-2 target nucleic acids are NOT DETECTED.  The SARS-CoV-2 RNA is generally detectable in upper and lower respiratory specimens during the acute phase of infection. The lowest concentration of SARS-CoV-2 viral copies this assay can detect is 250 copies / mL. Cougar Imel negative result does not preclude SARS-CoV-2 infection and should not be used as  the sole basis for treatment or other patient management decisions.  Autumnrose Yore negative result may occur with improper specimen collection / handling, submission of specimen other than nasopharyngeal swab, presence of viral mutation(s) within the areas targeted by this assay, and inadequate number of viral copies (<250 copies / mL). Willeen Novak negative result must be combined with clinical observations, patient history, and epidemiological information.  Fact Sheet for Patients:   StrictlyIdeas.no  Fact Sheet for Healthcare Providers: BankingDealers.co.za  This test is not yet approved or  cleared by the [place] FDA and has been authorized for detection and/or diagnosis of SARS-CoV-2 by FDA under an Emergency Use Authorization (EUA).  This EUA will remain in effect (meaning this test can be used) for the duration of the COVID-19 declaration under Section 564(b)(1) of the Act, 21 U.S.C. section 360bbb-3(b)(1), unless the authorization is terminated or revoked sooner.  Performed at [facility], [place] [address], [place], [place] [address]          Radiology Studies: DG Chest 2 View  Result Date: 03/03/2020 CLINICAL DATA:  History of diverticulitis. EXAM: CHEST - 2 VIEW COMPARISON:  None. FINDINGS: Midline trachea. Normal heart size for level of inspiration. Moderate right hemidiaphragm elevation. No pleural effusion or  pneumothorax. Bibasilar airspace disease. IMPRESSION: Bibasilar airspace disease. Especially given the elevated right hemidiaphragm, the right-sided opacity is most likely atelectasis. Left base opacity could represent atelectasis or pneumonia. Consider radiographic follow-up. Electronically Signed   By: Abigail Miyamoto M.D.   On: 03/03/2020 12:03   CT Renal Stone Study  Result Date: 03/02/2020 CLINICAL DATA:  Right-sided flank pain EXAM: CT ABDOMEN AND PELVIS WITHOUT CONTRAST TECHNIQUE: Multidetector CT imaging of the abdomen and pelvis was performed following the standard protocol without IV contrast. COMPARISON:  None. FINDINGS: Lower chest: The visualized heart size within normal limits. No pericardial fluid/thickening. Coronary artery calcifications are seen. There is rounded patchy airspace consolidation seen at the posterior right lung base with air bronchograms. There is streaky atelectasis at the left lung base. . Hepatobiliary: Although limited due to the lack of intravenous contrast, normal in appearance without gross focal abnormality. There appears to be diffuse fat stranding changes seen around the gallbladder with layering small stones/sludge. There is also mild diffuse gallbladder wall thickening. Pancreas:  Unremarkable.  No surrounding inflammatory changes. Spleen: Normal in size. Although limited due to the lack of intravenous contrast, normal in appearance. Adrenals/Urinary Tract: Both adrenal glands appear normal. The kidneys and collecting system appear normal without evidence of urinary tract calculus or hydronephrosis. Bladder is unremarkable. Stomach/Bowel: The stomach and small bowel are normal in appearance. There appears to be diffuse wall thickening seen at the hepatic flexure with small scattered colonic diverticula surrounding mesenteric fat stranding changes and tiny small lymph nodes present. No pericolonic loculated fluid collections or free air. The remainder of the colon is  unremarkable. Vascular/Lymphatic: There are no enlarged abdominal or pelvic lymph nodes. Scattered minimal aortic atherosclerosis is seen. Reproductive: The uterus and adnexa are unremarkable. Other: There is Trenda Corliss mild small amount of free fluid seen in the cul-de-sac. Musculoskeletal: No acute or significant osseous findings. Moderate bilateral hip osteoarthritis is seen with superior joint space loss and cystic changes. IMPRESSION: Findings suggestive of hepatic flexure diverticulitis. No surrounding loculated fluid collections or free air. Wall thickening and inflammatory changes around the gallbladder which could be due to acute cholecystitis. If further evaluation is required would recommend dedicated right upper quadrant ultrasound. Rounded patchy airspace opacity at the  right lung base which could be due to atelectasis and/or early infectious etiology. Aortic Atherosclerosis (ICD10-I70.0). Electronically Signed   By: Prudencio Pair M.D.   On: 03/02/2020 21:55   US Abdomen Limited RUQ  Result Date: 03/03/2020 CLINICAL DATA:  Right upper quadrant pain EXAM: ULTRASOUND ABDOMEN LIMITED RIGHT UPPER QUADRANT COMPARISON:  None. FINDINGS: Gallbladder: Gallstones are present measuring up to 2 cm. Mild wall thickening visualized. No pericholecystic fluid. No sonographic Murphy sign noted by sonographer. Common bile duct: Diameter: Limited visualization with normal diameter of 5 mm Liver: No focal lesion identified. Within normal limits in parenchymal echogenicity. Portal vein is patent on color Doppler imaging with normal direction of blood flow towards the liver. Other: Right pleural effusion. IMPRESSION: Cholelithiasis and mild gallbladder wall thickening. No additional sonographic evidence of acute cholecystitis. Electronically Signed   By: Macy Mis M.D.   On: 03/03/2020 08:44        Scheduled Meds: . enoxaparin (LOVENOX) injection  40 mg Subcutaneous Q24H  . loratadine  10 mg Oral Daily   And  .  pseudoephedrine  120 mg Oral BID  . pantoprazole  40 mg Oral BID   Continuous Infusions: . sodium chloride 100 mL/hr at 03/03/20 8063  . cefTRIAXone (ROCEPHIN)  IV Stopped (03/03/20 0515)  . metronidazole Stopped (03/03/20 0502)     LOS: 0 days    Time spent: over 30 min    Fayrene Helper, MD [facility]   To contact the attending provider between 7A-7P or the covering provider during after hours 7P-7A, please log into the web site [email or web address] and access using universal [place] password for that web site. If you do not have the password, please call the hospital operator.  03/03/2020, 12:32 PM

## 2020-03-04 LAB — HEPATIC FUNCTION PANEL
ALT: 19 U/L (ref 0–44)
AST: 15 U/L (ref 15–41)
Albumin: 3.3 g/dL — ABNORMAL LOW (ref 3.5–5.0)
Alkaline Phosphatase: 85 U/L (ref 38–126)
Bilirubin, Direct: 0.1 mg/dL (ref 0.0–0.2)
Indirect Bilirubin: 0.6 mg/dL (ref 0.3–0.9)
Total Bilirubin: 0.7 mg/dL (ref 0.3–1.2)
Total Protein: 6.6 g/dL (ref 6.5–8.1)

## 2020-03-04 LAB — BASIC METABOLIC PANEL
Anion gap: 12 (ref 5–15)
BUN: 9 mg/dL (ref 6–20)
CO2: 24 mmol/L (ref 22–32)
Calcium: 8.6 mg/dL — ABNORMAL LOW (ref 8.9–10.3)
Chloride: 101 mmol/L (ref 98–111)
Creatinine, Ser: 0.66 mg/dL (ref 0.44–1.00)
GFR calc Af Amer: >60 mL/min (ref >60)
GFR calc non Af Amer: >60 mL/min (ref >60)
Glucose, Bld: 91 mg/dL (ref 70–99)
Potassium: 4 mmol/L (ref 3.5–5.1)
Sodium: 137 mmol/L (ref 135–145)

## 2020-03-04 LAB — CBC
HCT: 33.9 % — ABNORMAL LOW (ref 36.0–46.0)
Hemoglobin: 11 g/dL — ABNORMAL LOW (ref 12.0–15.0)
MCH: 28.7 pg (ref 26.0–34.0)
MCHC: 32.4 g/dL (ref 30.0–36.0)
MCV: 88.5 fL (ref 80.0–100.0)
Platelets: 234 10*3/uL (ref 150–400)
RBC: 3.83 MIL/uL — ABNORMAL LOW (ref 3.87–5.11)
RDW: 13.1 % (ref 11.5–15.5)
WBC: 11.6 10*3/uL — ABNORMAL HIGH (ref 4.0–10.5)
nRBC: 0 % (ref 0.0–0.2)

## 2020-03-04 NOTE — Progress Notes (Signed)
PROGRESS NOTE    Denise Lloyd  TLX:[id number] DOB: [birth year] DOA: 03/03/2020 PCP: Patient, No Pcp Per    Brief Narrative: Denise Lloyd is a 59 y.o. female with medical history significant for GERD with esophagitis, presenting to the emergency department with abdominal pain and loss of appetite.  Patient reports that she developed generalized abdominal discomfort couple days ago, was seen at an urgent care where she reports having unremarkable urinalysis, was told that her blood work looked okay and that she did not have any gallstones, and went home with Norco, ciprofloxacin, and Flagyl.  She went on to have more severe pain which was localizing towards the right side of her abdomen, becoming severe, not controlled with Norco, and ultimately prompting her presentation to the ED.  She has not noticed any fevers or chills, reports loss of appetite but denies vomiting, denies diarrhea, and denies melena or hematochezia.  She has never experienced this before.  She has tried to eat a little bit while taking her antibiotics at home and has not noticed any worsening in her symptoms after eating.  She denies any shortness of breath or cough.  She denies chest pain.  ED Course: Upon arrival to the ED, patient is found to be afebrile, saturating 90s on room air, heart rate 100, and blood pressure 130/90.  Chemistry panel is unremarkable and CBC notable for leukocytosis to 17,900.  CT of the abdomen and pelvis is concerning for acute diverticulitis at the hepatic flexure without fluid collection or free air.  Also noted on the CT is some gallbladder wall thickening and atelectasis versus early infection at the right base.  Patient was given a liter of IV fluids, Dilaudid, and started on antibiotics in the ED.  LFTs, lipase, urinalysis, and Covid screening test are all pending at this time.  Assessment & Plan:   Principal Problem:   Acute diverticulitis Active Problems:   Sepsis (Section)   #1 acute  diverticulitis-with sepsis present on admission patient met sepsis criteria on admission with tachycardia and leukocytosis with evidence of diverticulitis.  She was admitted with severe abdominal pain.  CT shows hepatic flexure diverticulitis with wall thickening and inflammatory changes around the gallbladder.  Right upper quadrant ultrasound showed cholelithiasis with mild gallbladder wall thickening.  There was no sonographic evidence of acute cholecystitis.  Patient is improving on n.p.o. IV fluids and IV Rocephin and Flagyl. White count is 11.6 down from 18 on admission. She has been tolerating clear liquid diet will advance to full liquids diet today.  #2 abnormal chest x-ray likely secondary to atelectasis patient has no respiratory complaints.   Estimated body mass index is 35.36 kg/m as calculated from the following:   Height as of this encounter: _0  (1.702 m).   Weight as of this encounter: 102.4 kg.  DVT prophylaxis: Lovenox Code Status: Full code Family Communication: None at bedside Disposition Plan:  Status is: Inpatient   Dispo: The patient is from: Home              Anticipated d/c is to: Home              Anticipated d/c date is: 1 to 2 days              Patient currently is not medically stable to d/c.    Consultants: None  Procedures: None Antimicrobials: Rocephin and Flagyl Subjective:  Feeling better still on clear liquids denies any vomiting continues to have abdominal pain Objective:  Vitals:   03/03/20 1351 03/03/20 2218 03/04/20 0526 03/04/20 1318  BP: 116/73 137/73 130/75 (!) 146/91  Pulse: 82 93 84 89  Resp: _0 Temp: 97.6 F (36.4 C) 98.1 F (36.7 C) 98 F (36.7 C) 98.7 F (37.1 C)  TempSrc: Oral Oral Oral Oral  SpO2: 95% 90% 96% 97%  Weight:      Height:        Intake/Output Summary (Last 24 hours) at 03/04/2020 1515 Last data filed at 03/04/2020 1400 Gross per 24 hour  Intake 680 ml  Output 300 ml  Net 380 ml   Filed  Weights   03/03/20 0824  Weight: 102.4 kg    Examination:  General exam: Appears calm and comfortable  Respiratory system: Clear to auscultation. Respiratory effort normal. Cardiovascular system: S1 & S2 heard, RRR. No JVD, murmurs, rubs, gallops or clicks. No pedal edema. Gastrointestinal system: Abdomen is nondistended, soft and RUQ tender. No organomegaly or masses felt. Normal bowel sounds heard. Central nervous system: Alert and oriented. No focal neurological deficits. Extremities: Symmetric 5 x 5 power. Skin: No rashes, lesions or ulcers Psychiatry: Judgement and insight appear normal. Mood & affect appropriate.     Data Reviewed: I have personally reviewed following labs and imaging studies  CBC: Recent Labs  Lab 03/02/20 2122 03/03/20 0645 03/04/20 0449  WBC 17.9* 18.0* 11.6*  HGB 12.7 12.1 11.0*  HCT 39.0 36.8 33.9*  MCV 87.1 87.8 88.5  PLT 256 254 462   Basic Metabolic Panel: Recent Labs  Lab 03/02/20 2122 03/03/20 0448 03/04/20 0449  NA 134*  --  137  K 4.0  --  4.0  CL 102  --  101  CO2 23  --  24  GLUCOSE 121*  --  91  BUN 16  --  9  CREATININE 0.78  --  0.66  CALCIUM 9.2  --  8.6*  MG  --  1.7  --    GFR: Estimated Creatinine Clearance: 93.1 mL/min (by C-G formula based on SCr of 0.66 mg/dL). Liver Function Tests: Recent Labs  Lab 03/03/20 0338 03/04/20 0449  AST 19 15  ALT 22 19  ALKPHOS 91 85  BILITOT 1.3* 0.7  PROT 7.9 6.6  ALBUMIN 4.0 3.3*   Recent Labs  Lab 03/03/20 0338  LIPASE 26   No results for input(s): AMMONIA in the last 168 hours. Coagulation Profile: No results for input(s): INR, PROTIME in the last 168 hours. Cardiac Enzymes: No results for input(s): CKTOTAL, CKMB, CKMBINDEX, TROPONINI in the last 168 hours. BNP (last 3 results) No results for input(s): PROBNP in the last 8760 hours. HbA1C: No results for input(s): HGBA1C in the last 72 hours. CBG: No results for input(s): GLUCAP in the last 168 hours. Lipid  Profile: No results for input(s): CHOL, HDL, LDLCALC, TRIG, CHOLHDL, LDLDIRECT in the last 72 hours. Thyroid Function Tests: No results for input(s): TSH, T4TOTAL, FREET4, T3FREE, THYROIDAB in the last 72 hours. Anemia Panel: No results for input(s): VITAMINB12, FOLATE, FERRITIN, TIBC, IRON, RETICCTPCT in the last 72 hours. Sepsis Labs: Recent Labs  Lab 03/03/20 0343  LATICACIDVEN 0.7    Recent Results (from the past 240 hour(s))  SARS Coronavirus 2 by RT PCR (hospital order, performed in [facility] hospital lab) Nasopharyngeal Nasopharyngeal Swab     Status: None   Collection Time: 03/03/20  3:29 AM   Specimen: Nasopharyngeal Swab  Result Value Ref Range Status   SARS Coronavirus 2 NEGATIVE NEGATIVE Final  Comment: (NOTE) SARS-CoV-2 target nucleic acids are NOT DETECTED.  The SARS-CoV-2 RNA is generally detectable in upper and lower respiratory specimens during the acute phase of infection. The lowest concentration of SARS-CoV-2 viral copies this assay can detect is 250 copies / mL. A negative result does not preclude SARS-CoV-2 infection and should not be used as the sole basis for treatment or other patient management decisions.  A negative result may occur with improper specimen collection / handling, submission of specimen other than nasopharyngeal swab, presence of viral mutation(s) within the areas targeted by this assay, and inadequate number of viral copies (<250 copies / mL). A negative result must be combined with clinical observations, patient history, and epidemiological information.  Fact Sheet for Patients:   StrictlyIdeas.no  Fact Sheet for Healthcare Providers: BankingDealers.co.za  This test is not yet approved or  cleared by the [place] FDA and has been authorized for detection and/or diagnosis of SARS-CoV-2 by FDA under an Emergency Use Authorization (EUA).  This EUA will remain in effect (meaning  this test can be used) for the duration of the COVID-19 declaration under Section 564(b)(1) of the Act, 21 U.S.C. section 360bbb-3(b)(1), unless the authorization is terminated or revoked sooner.  Performed at [facility], [place] [address] [place], [place] [address]   Culture, blood (routine x 2)     Status: None (Preliminary result)   Collection Time: 03/03/20  3:43 AM   Specimen: BLOOD  Result Value Ref Range Status   Specimen Description   Final    BLOOD HAND Soft TICK Performed at [facility] [address], [place], [place] [address]    Special Requests   Final    BOTTLES DRAWN AEROBIC ONLY Blood Culture adequate volume Performed at [facility] [address] [place], [place] [address]    Culture   Final    NO GROWTH < 24 HOURS Performed at [facility] [address], [place], [place] [address]    Report Status PENDING  Incomplete  Culture, blood (routine x 2)     Status: None (Preliminary result)   Collection Time: 03/03/20  3:48 AM   Specimen: BLOOD  Result Value Ref Range Status   Specimen Description   Final    BLOOD HAND Soft TICK Performed at [facility] [address] [place], [place] [address]    Special Requests   Final    BOTTLES DRAWN AEROBIC ONLY Blood Culture adequate volume Performed at [facility] [address] [place], [place] [address]    Culture   Final    NO GROWTH < 24 HOURS Performed at [facility] [address], [place], [place] [address]    Report Status PENDING  Incomplete         Radiology Studies: DG Chest 2 View  Result Date: 03/03/2020 CLINICAL DATA:  History of diverticulitis. EXAM: CHEST - 2 VIEW COMPARISON:  None. FINDINGS: Midline trachea. Normal heart size for level of inspiration. Moderate right hemidiaphragm elevation. No pleural effusion or pneumothorax. Bibasilar airspace disease. IMPRESSION: Bibasilar  airspace disease. Especially given the elevated right hemidiaphragm, the right-sided opacity is most likely atelectasis. Left base opacity could represent atelectasis or pneumonia. Consider radiographic follow-up. Electronically Signed   By: Abigail Miyamoto M.D.   On: 03/03/2020 12:03   CT Renal Stone Study  Result Date: 03/02/2020 CLINICAL DATA:  Right-sided flank pain EXAM: CT ABDOMEN AND PELVIS WITHOUT CONTRAST TECHNIQUE: Multidetector  CT imaging of the abdomen and pelvis was performed following the standard protocol without IV contrast. COMPARISON:  None. FINDINGS: Lower chest: The visualized heart size within normal limits. No pericardial fluid/thickening. Coronary artery calcifications are seen. There is rounded patchy airspace consolidation seen at the posterior right lung base with air bronchograms. There is streaky atelectasis at the left lung base. . Hepatobiliary: Although limited due to the lack of intravenous contrast, normal in appearance without gross focal abnormality. There appears to be diffuse fat stranding changes seen around the gallbladder with layering small stones/sludge. There is also mild diffuse gallbladder wall thickening. Pancreas:  Unremarkable.  No surrounding inflammatory changes. Spleen: Normal in size. Although limited due to the lack of intravenous contrast, normal in appearance. Adrenals/Urinary Tract: Both adrenal glands appear normal. The kidneys and collecting system appear normal without evidence of urinary tract calculus or hydronephrosis. Bladder is unremarkable. Stomach/Bowel: The stomach and small bowel are normal in appearance. There appears to be diffuse wall thickening seen at the hepatic flexure with small scattered colonic diverticula surrounding mesenteric fat stranding changes and tiny small lymph nodes present. No pericolonic loculated fluid collections or free air. The remainder of the colon is unremarkable. Vascular/Lymphatic: There are no enlarged abdominal or  pelvic lymph nodes. Scattered minimal aortic atherosclerosis is seen. Reproductive: The uterus and adnexa are unremarkable. Other: There is a mild small amount of free fluid seen in the cul-de-sac. Musculoskeletal: No acute or significant osseous findings. Moderate bilateral hip osteoarthritis is seen with superior joint space loss and cystic changes. IMPRESSION: Findings suggestive of hepatic flexure diverticulitis. No surrounding loculated fluid collections or free air. Wall thickening and inflammatory changes around the gallbladder which could be due to acute cholecystitis. If further evaluation is required would recommend dedicated right upper quadrant ultrasound. Rounded patchy airspace opacity at the right lung base which could be due to atelectasis and/or early infectious etiology. Aortic Atherosclerosis (ICD10-I70.0). Electronically Signed   By: Prudencio Pair M.D.   On: 03/02/2020 21:55   US Abdomen Limited RUQ  Result Date: 03/03/2020 CLINICAL DATA:  Right upper quadrant pain EXAM: ULTRASOUND ABDOMEN LIMITED RIGHT UPPER QUADRANT COMPARISON:  None. FINDINGS: Gallbladder: Gallstones are present measuring up to 2 cm. Mild wall thickening visualized. No pericholecystic fluid. No sonographic Murphy sign noted by sonographer. Common bile duct: Diameter: Limited visualization with normal diameter of 5 mm Liver: No focal lesion identified. Within normal limits in parenchymal echogenicity. Portal vein is patent on color Doppler imaging with normal direction of blood flow towards the liver. Other: Right pleural effusion. IMPRESSION: Cholelithiasis and mild gallbladder wall thickening. No additional sonographic evidence of acute cholecystitis. Electronically Signed   By: Macy Mis M.D.   On: 03/03/2020 08:44        Scheduled Meds:  enoxaparin (LOVENOX) injection  40 mg Subcutaneous Q24H   loratadine  10 mg Oral Daily   And   pseudoephedrine  120 mg Oral BID   pantoprazole  40 mg Oral BID    Continuous Infusions:  cefTRIAXone (ROCEPHIN)  IV 2 g (03/04/20 0617)   metronidazole 500 mg (03/04/20 1330)     LOS: 1 day     Georgette Shell, MD 03/04/2020, 3:15 PM

## 2020-03-05 LAB — BASIC METABOLIC PANEL
Anion gap: 11 (ref 5–15)
BUN: 9 mg/dL (ref 6–20)
CO2: 23 mmol/L (ref 22–32)
Calcium: 8.5 mg/dL — ABNORMAL LOW (ref 8.9–10.3)
Chloride: 103 mmol/L (ref 98–111)
Creatinine, Ser: 0.56 mg/dL (ref 0.44–1.00)
GFR calc Af Amer: >60 mL/min (ref >60)
GFR calc non Af Amer: >60 mL/min (ref >60)
Glucose, Bld: 92 mg/dL (ref 70–99)
Potassium: 3.4 mmol/L — ABNORMAL LOW (ref 3.5–5.1)
Sodium: 137 mmol/L (ref 135–145)

## 2020-03-05 LAB — CBC
HCT: 33.4 % — ABNORMAL LOW (ref 36.0–46.0)
Hemoglobin: 10.8 g/dL — ABNORMAL LOW (ref 12.0–15.0)
MCH: 28.3 pg (ref 26.0–34.0)
MCHC: 32.3 g/dL (ref 30.0–36.0)
MCV: 87.7 fL (ref 80.0–100.0)
Platelets: 253 10*3/uL (ref 150–400)
RBC: 3.81 MIL/uL — ABNORMAL LOW (ref 3.87–5.11)
RDW: 12.9 % (ref 11.5–15.5)
WBC: 9.2 10*3/uL (ref 4.0–10.5)
nRBC: 0 % (ref 0.0–0.2)

## 2020-03-05 MED ORDER — ONDANSETRON HCL 4 MG PO TABS: 4.0000 mg | ORAL_TABLET | Freq: Four times a day (QID) | ORAL | 0 refills | Status: DC | PRN

## 2020-03-05 MED ORDER — CIPROFLOXACIN HCL 500 MG PO TABS
500.0000 mg | ORAL_TABLET | Freq: Two times a day (BID) | ORAL | 0 refills | Status: AC
Start: 2020-03-05 — End: 2020-03-15

## 2020-03-05 MED ORDER — METRONIDAZOLE 500 MG PO TABS
500.0000 mg | ORAL_TABLET | Freq: Three times a day (TID) | ORAL | 0 refills | Status: AC
Start: 2020-03-05 — End: 2020-03-19

## 2020-03-05 NOTE — Plan of Care (Signed)
Pt was discharged home. Discharge instructions were given to pt and all questions were answered. Pt taken to main entrance.

## 2020-03-05 NOTE — Discharge Summary (Signed)
Physician Discharge Summary  Denise Lloyd NOM:[id number] DOB: 1961-01-06 DOA: 03/03/2020  PCP: Patient, No Pcp Per  Admit date: 03/03/2020 Discharge date: 03/05/2020  Admitted From: Home Disposition: Home Recommendations for Outpatient Follow-up:  1. Follow up with PCP in 1-2 weeks 2. Please obtain BMP/CBC in one week 3. Please follow up GI next week  Home Health: None Equipment/Devices: None  Discharge Condition: Stable CODE STATUS: Full code Diet recommendation: Cardiac Brief/Interim Summary:Denise L Feltsis a 59 y.o.femalewith medical history significant forGERD with esophagitis, presenting to the emergency department with abdominal pain and loss of appetite. Patient reports that she developed generalized abdominal discomfort couple days ago, was seen at an urgent care where she reports having unremarkable urinalysis, was told that her blood work looked okay and that she did not have any gallstones, and went home with Norco, ciprofloxacin, and Flagyl. She went on to have more severe pain which was localizing towards the right side of her abdomen, becoming severe, not controlled with Norco, and ultimately prompting her presentation to the ED. She has not noticed any fevers or chills, reports loss of appetite but denies vomiting, denies diarrhea, and denies melena or hematochezia. She has never experienced this before. She has tried to eat a little bit while taking her antibiotics at home and has not noticed any worsening in her symptoms after eating. She denies any shortness of breath or cough. She denies chest pain.  ED Course:Upon arrival to the ED, patient is found to be afebrile, saturating 90s on room air, heart rate 100, and blood pressure 130/90. Chemistry panel is unremarkable and CBC notable for leukocytosis to 17,900. CT of the abdomen and pelvis is concerning for acute diverticulitis at the hepatic flexure without fluid collection or free air. Also noted on the CT is some  gallbladder wall thickening and atelectasis versus early infection at the right base. Patient was given a liter of IV fluids, Dilaudid, and started on antibiotics in the ED. LFTs, lipase, urinalysis, and Covid screening test are all pending at this time. Discharge Diagnoses:  Principal Problem:   Acute diverticulitis Active Problems:   Sepsis ([place])   #1 acute diverticulitis-with sepsis present on admission patient met sepsis criteria on admission with tachycardia and leukocytosis with evidence of diverticulitis.  She was admitted with severe abdominal pain.   CT showed  hepatic flexure diverticulitis with wall thickening and inflammatory changes around the gallbladder.  Right upper quadrant ultrasound showed cholelithiasis with mild gallbladder wall thickening.  There was no sonographic evidence of acute cholecystitis. Patient was treated with Rocephin and Flagyl.  She tolerated a regular diet prior to discharge.  She already has a follow-up appointment with a GI specialist in [place] a week from today.  She also has found a primary care physician who is new to her and she will follow up with PCP.   #2 abnormal chest x-ray likely secondary to atelectasis patient has no respiratory complaints.  Estimated body mass index is 35.36 kg/m as calculated from the following:   Height as of this encounter: _0  (1.702 m).   Weight as of this encounter: 102.4 kg.  Discharge Instructions  Discharge Instructions    Diet - low sodium heart healthy   Complete by: As directed    Increase activity slowly   Complete by: As directed      Allergies as of 03/05/2020   No Known Allergies     Medication List    STOP taking these medications   ibuprofen 200  MG tablet Commonly known as: ADVIL     TAKE these medications   ciprofloxacin 500 MG tablet Commonly known as: Cipro Take 1 tablet (500 mg total) by mouth 2 (two) times daily for 10 days. What changed: when to take this    glucosamine-chondroitin 500-400 MG tablet Take 2 tablets by mouth daily.   HYDROcodone-acetaminophen 5-325 MG tablet Commonly known as: NORCO/VICODIN Take 1 tablet by mouth every 4 (four) hours as needed for moderate pain or severe pain.   loratadine-pseudoephedrine 10-240 MG 24 hr tablet Commonly known as: CLARITIN-D 24-hour Take 1 tablet by mouth daily.   metroNIDAZOLE 500 MG tablet Commonly known as: Flagyl Take 1 tablet (500 mg total) by mouth 3 (three) times daily for 14 days.   multivitamin with minerals Tabs tablet Take 1 tablet by mouth daily.   Muro 128 5 % ophthalmic ointment Generic drug: sodium chloride Place 1 application into both eyes at bedtime.   omeprazole 40 MG capsule Commonly known as: PRILOSEC Take 40 mg by mouth 2 (two) times daily.   ondansetron 4 MG tablet Commonly known as: ZOFRAN Take 1 tablet (4 mg total) by mouth every 6 (six) hours as needed for nausea.       No Known Allergies  Consultations: none  Procedures/Studies: DG Chest 2 View  Result Date: 03/03/2020 CLINICAL DATA:  History of diverticulitis. EXAM: CHEST - 2 VIEW COMPARISON:  None. FINDINGS: Midline trachea. Normal heart size for level of inspiration. Moderate right hemidiaphragm elevation. No pleural effusion or pneumothorax. Bibasilar airspace disease. IMPRESSION: Bibasilar airspace disease. Especially given the elevated right hemidiaphragm, the right-sided opacity is most likely atelectasis. Left base opacity could represent atelectasis or pneumonia. Consider radiographic follow-up. Electronically Signed   By: Abigail Miyamoto M.D.   On: 03/03/2020 12:03   CT Renal Stone Study  Result Date: 03/02/2020 CLINICAL DATA:  Right-sided flank pain EXAM: CT ABDOMEN AND PELVIS WITHOUT CONTRAST TECHNIQUE: Multidetector CT imaging of the abdomen and pelvis was performed following the standard protocol without IV contrast. COMPARISON:  None. FINDINGS: Lower chest: The visualized heart size  within normal limits. No pericardial fluid/thickening. Coronary artery calcifications are seen. There is rounded patchy airspace consolidation seen at the posterior right lung base with air bronchograms. There is streaky atelectasis at the left lung base. . Hepatobiliary: Although limited due to the lack of intravenous contrast, normal in appearance without gross focal abnormality. There appears to be diffuse fat stranding changes seen around the gallbladder with layering small stones/sludge. There is also mild diffuse gallbladder wall thickening. Pancreas:  Unremarkable.  No surrounding inflammatory changes. Spleen: Normal in size. Although limited due to the lack of intravenous contrast, normal in appearance. Adrenals/Urinary Tract: Both adrenal glands appear normal. The kidneys and collecting system appear normal without evidence of urinary tract calculus or hydronephrosis. Bladder is unremarkable. Stomach/Bowel: The stomach and small bowel are normal in appearance. There appears to be diffuse wall thickening seen at the hepatic flexure with small scattered colonic diverticula surrounding mesenteric fat stranding changes and tiny small lymph nodes present. No pericolonic loculated fluid collections or free air. The remainder of the colon is unremarkable. Vascular/Lymphatic: There are no enlarged abdominal or pelvic lymph nodes. Scattered minimal aortic atherosclerosis is seen. Reproductive: The uterus and adnexa are unremarkable. Other: There is a mild small amount of free fluid seen in the cul-de-sac. Musculoskeletal: No acute or significant osseous findings. Moderate bilateral hip osteoarthritis is seen with superior joint space loss and cystic changes. IMPRESSION: Findings suggestive of  hepatic flexure diverticulitis. No surrounding loculated fluid collections or free air. Wall thickening and inflammatory changes around the gallbladder which could be due to acute cholecystitis. If further evaluation is  required would recommend dedicated right upper quadrant ultrasound. Rounded patchy airspace opacity at the right lung base which could be due to atelectasis and/or early infectious etiology. Aortic Atherosclerosis (ICD10-I70.0). Electronically Signed   By: Prudencio Pair M.D.   On: 03/02/2020 21:55   US Abdomen Limited RUQ  Result Date: 03/03/2020 CLINICAL DATA:  Right upper quadrant pain EXAM: ULTRASOUND ABDOMEN LIMITED RIGHT UPPER QUADRANT COMPARISON:  None. FINDINGS: Gallbladder: Gallstones are present measuring up to 2 cm. Mild wall thickening visualized. No pericholecystic fluid. No sonographic Murphy sign noted by sonographer. Common bile duct: Diameter: Limited visualization with normal diameter of 5 mm Liver: No focal lesion identified. Within normal limits in parenchymal echogenicity. Portal vein is patent on color Doppler imaging with normal direction of blood flow towards the liver. Other: Right pleural effusion. IMPRESSION: Cholelithiasis and mild gallbladder wall thickening. No additional sonographic evidence of acute cholecystitis. Electronically Signed   By: Macy Mis M.D.   On: 03/03/2020 08:44    (Echo, Carotid, EGD, Colonoscopy, ERCP)    Subjective: Patient resting in bed anxious to go home tolerated lunch and breakfast  Discharge Exam: Vitals:   03/04/20 2052 03/05/20 0610  BP: (!) 154/88 (!) 156/91  Pulse: 89 82  Resp: 18 18  Temp: 98.6 F (37 C) 98.7 F (37.1 C)  SpO2: 95% 93%   Vitals:   03/04/20 0526 03/04/20 1318 03/04/20 2052 03/05/20 0610  BP: 130/75 (!) 146/91 (!) 154/88 (!) 156/91  Pulse: 84 89 89 82  Resp: _0 Temp: 98 F (36.7 C) 98.7 F (37.1 C) 98.6 F (37 C) 98.7 F (37.1 C)  TempSrc: Oral Oral Oral Oral  SpO2: 96% 97% 95% 93%  Weight:      Height:        General: Pt is alert, awake, not in acute distress Cardiovascular: RRR, S1/S2 +, no rubs, no gallops Respiratory: CTA bilaterally, no wheezing, no rhonchi Abdominal: Soft, NT,  ND, bowel sounds + Extremities: no edema, no cyanosis    The results of significant diagnostics from this hospitalization (including imaging, microbiology, ancillary and laboratory) are listed below for reference.     Microbiology: Recent Results (from the past 240 hour(s))  SARS Coronavirus 2 by RT PCR (hospital order, performed in [facility] hospital lab) Nasopharyngeal Nasopharyngeal Swab     Status: None   Collection Time: 03/03/20  3:29 AM   Specimen: Nasopharyngeal Swab  Result Value Ref Range Status   SARS Coronavirus 2 NEGATIVE NEGATIVE Final    Comment: (NOTE) SARS-CoV-2 target nucleic acids are NOT DETECTED.  The SARS-CoV-2 RNA is generally detectable in upper and lower respiratory specimens during the acute phase of infection. The lowest concentration of SARS-CoV-2 viral copies this assay can detect is 250 copies / mL. A negative result does not preclude SARS-CoV-2 infection and should not be used as the sole basis for treatment or other patient management decisions.  A negative result may occur with improper specimen collection / handling, submission of specimen other than nasopharyngeal swab, presence of viral mutation(s) within the areas targeted by this assay, and inadequate number of viral copies (<250 copies / mL). A negative result must be combined with clinical observations, patient history, and epidemiological information.  Fact Sheet for Patients:   StrictlyIdeas.no  Fact Sheet for Healthcare Providers:  BankingDealers.co.za  This test is not yet approved or  cleared by the [place] and has been authorized for detection and/or diagnosis of SARS-CoV-2 by FDA under an Emergency Use Authorization (EUA).  This EUA will remain in effect (meaning this test can be used) for the duration of the COVID-19 declaration under Section 564(b)(1) of the Act, 21 U.S.C. section 360bbb-3(b)(1), unless the authorization  is terminated or revoked sooner.  Performed at [facility], [place] [address] [place], [place] [address]   Culture, blood (routine x 2)     Status: None (Preliminary result)   Collection Time: 03/03/20  3:43 AM   Specimen: BLOOD  Result Value Ref Range Status   Specimen Description   Final    BLOOD HAND Soft TICK Performed at [facility] [address] [place], [place] [address]    Special Requests   Final    BOTTLES DRAWN AEROBIC ONLY Blood Culture adequate volume Performed at [facility] [address], [place], [place] [address]    Culture   Final    NO GROWTH 2 DAYS Performed at [facility] [address] [place], [place] [address]    Report Status PENDING  Incomplete  Culture, blood (routine x 2)     Status: None (Preliminary result)   Collection Time: 03/03/20  3:48 AM   Specimen: BLOOD  Result Value Ref Range Status   Specimen Description   Final    BLOOD HAND Soft TICK Performed at [facility] [address], [place], [place] [address]    Special Requests   Final    BOTTLES DRAWN AEROBIC ONLY Blood Culture adequate volume Performed at [facility] [address] [place], [place] [address]    Culture   Final    NO GROWTH 2 DAYS Performed at [facility] [address] [place], [place] [address]    Report Status PENDING  Incomplete     Labs: BNP (last 3 results) No results for input(s): BNP in the last 8760 hours. Basic Metabolic Panel: Recent Labs  Lab 03/02/20 2122 03/03/20 0448 03/04/20 0449 03/05/20 0519  NA 134*  --  137 137  K 4.0  --  4.0 3.4*  CL 102  --  101 103  CO2 23  --  24 23  GLUCOSE 121*  --  91 92  BUN 16  --  9 9  CREATININE 0.78  --  0.66 0.56  CALCIUM 9.2  --  8.6* 8.5*  MG  --  1.7  --   --    Liver Function Tests: Recent Labs  Lab 03/03/20 0338 03/04/20 0449  AST 19 15  ALT 22 19  ALKPHOS 91 85   BILITOT 1.3* 0.7  PROT 7.9 6.6  ALBUMIN 4.0 3.3*   Recent Labs  Lab 03/03/20 0338  LIPASE 26   No results for input(s): AMMONIA in the last 168 hours. CBC: Recent Labs  Lab 03/02/20 2122 03/03/20 0645 03/04/20 0449 03/05/20 0519  WBC 17.9* 18.0* 11.6* 9.2  HGB 12.7 12.1 11.0* 10.8*  HCT 39.0 36.8 33.9* 33.4*  MCV 87.1 87.8 88.5 87.7  PLT 256 254 234 253   Cardiac Enzymes: No results for input(s): CKTOTAL, CKMB, CKMBINDEX, TROPONINI in the last 168 hours. BNP: Invalid input(s): POCBNP CBG: No results for input(s): GLUCAP in the last 168 hours. D-Dimer No results for input(s): DDIMER in the last 72 hours. Hgb A1c No  results for input(s): HGBA1C in the last 72 hours. Lipid Profile No results for input(s): CHOL, HDL, LDLCALC, TRIG, CHOLHDL, LDLDIRECT in the last 72 hours. Thyroid function studies No results for input(s): TSH, T4TOTAL, T3FREE, THYROIDAB in the last 72 hours.  Invalid input(s): FREET3 Anemia work up No results for input(s): VITAMINB12, FOLATE, FERRITIN, TIBC, IRON, RETICCTPCT in the last 72 hours. Urinalysis    Component Value Date/Time   COLORURINE AMBER (A) 03/03/2020 0339   APPEARANCEUR CLEAR 03/03/2020 0339   LABSPEC 1.028 03/03/2020 0339   PHURINE 5.0 03/03/2020 0339   GLUCOSEU NEGATIVE 03/03/2020 0339   HGBUR SMALL (A) 03/03/2020 0339   BILIRUBINUR NEGATIVE 03/03/2020 0339   KETONESUR 5 (A) 03/03/2020 0339   PROTEINUR NEGATIVE 03/03/2020 0339   NITRITE NEGATIVE 03/03/2020 0339   LEUKOCYTESUR TRACE (A) 03/03/2020 0339   Sepsis Labs Invalid input(s): PROCALCITONIN,  WBC,  LACTICIDVEN Microbiology Recent Results (from the past 240 hour(s))  SARS Coronavirus 2 by RT PCR (hospital order, performed in [facility] hospital lab) Nasopharyngeal Nasopharyngeal Swab     Status: None   Collection Time: 03/03/20  3:29 AM   Specimen: Nasopharyngeal Swab  Result Value Ref Range Status   SARS Coronavirus 2 NEGATIVE NEGATIVE Final    Comment:  (NOTE) SARS-CoV-2 target nucleic acids are NOT DETECTED.  The SARS-CoV-2 RNA is generally detectable in upper and lower respiratory specimens during the acute phase of infection. The lowest concentration of SARS-CoV-2 viral copies this assay can detect is 250 copies / mL. A negative result does not preclude SARS-CoV-2 infection and should not be used as the sole basis for treatment or other patient management decisions.  A negative result may occur with improper specimen collection / handling, submission of specimen other than nasopharyngeal swab, presence of viral mutation(s) within the areas targeted by this assay, and inadequate number of viral copies (<250 copies / mL). A negative result must be combined with clinical observations, patient history, and epidemiological information.  Fact Sheet for Patients:   StrictlyIdeas.no  Fact Sheet for Healthcare Providers: BankingDealers.co.za  This test is not yet approved or  cleared by the [place] FDA and has been authorized for detection and/or diagnosis of SARS-CoV-2 by FDA under an Emergency Use Authorization (EUA).  This EUA will remain in effect (meaning this test can be used) for the duration of the COVID-19 declaration under Section 564(b)(1) of the Act, 21 U.S.C. section 360bbb-3(b)(1), unless the authorization is terminated or revoked sooner.  Performed at [facility], [place] [address], [place], [place] [address]   Culture, blood (routine x 2)     Status: None (Preliminary result)   Collection Time: 03/03/20  3:43 AM   Specimen: BLOOD  Result Value Ref Range Status   Specimen Description   Final    BLOOD HAND Soft TICK Performed at [facility] [address], [place], [place] [address]    Special Requests   Final    BOTTLES DRAWN AEROBIC ONLY Blood Culture adequate volume Performed at [facility]  [address] [place], [place] [address]    Culture   Final    NO GROWTH 2 DAYS Performed at [facility] [address], [place], [place] [address]    Report Status PENDING  Incomplete  Culture, blood (routine x 2)     Status: None (Preliminary result)   Collection Time: 03/03/20  3:48 AM   Specimen: BLOOD  Result Value Ref Range Status   Specimen  Description   Final    BLOOD HAND Soft TICK Performed at [facility] [address] [place], [place] [address]    Special Requests   Final    BOTTLES DRAWN AEROBIC ONLY Blood Culture adequate volume Performed at [facility] [address] [place], [place] [address]    Culture   Final    NO GROWTH 2 DAYS Performed at [facility] [address], [place], [place] [address]    Report Status PENDING  Incomplete     Time coordinating discharge:  39 minutes  SIGNED:   Georgette Shell, MD  [facility] 03/05/2020, 1:32 PM  If 7PM-7AM, please contact night-coverage [email or web address] Password [email or web address]

## 2020-03-08 LAB — CULTURE, BLOOD (ROUTINE X 2)
Culture: NO GROWTH
Culture: NO GROWTH
Special Requests: ADEQUATE
Special Requests: ADEQUATE

## 2021-08-25 IMAGING — CT CT RENAL STONE PROTOCOL
2 of 4 series · 15 of 46 positions shown, 17 images · non-contrast
Comparison: None.

CLINICAL DATA: Right-sided flank pain

EXAM:
CT ABDOMEN AND PELVIS WITHOUT CONTRAST
TECHNIQUE: Multidetector CT imaging of the abdomen and pelvis was performed
following the standard protocol without IV contrast.

[Series 2: axial st · axial · 0.83mm/px · z∈[-487,-37]mm · 12 of 104 slices shown, 14 images]
[im 7/104  soft-tissue]
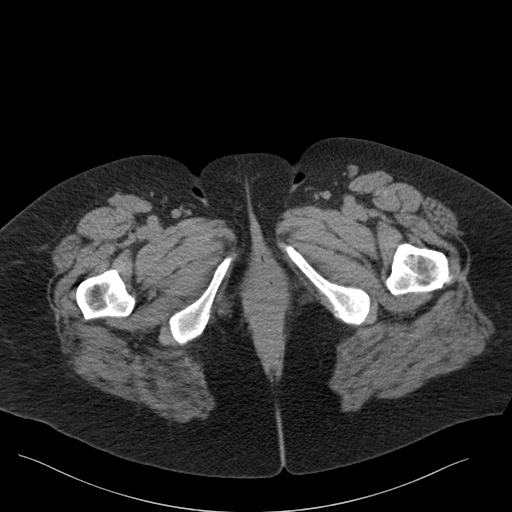
[im 7/104  bone]
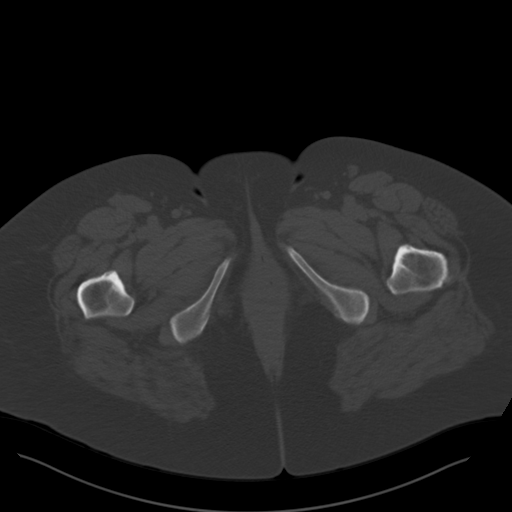
[im 19/104  soft-tissue]
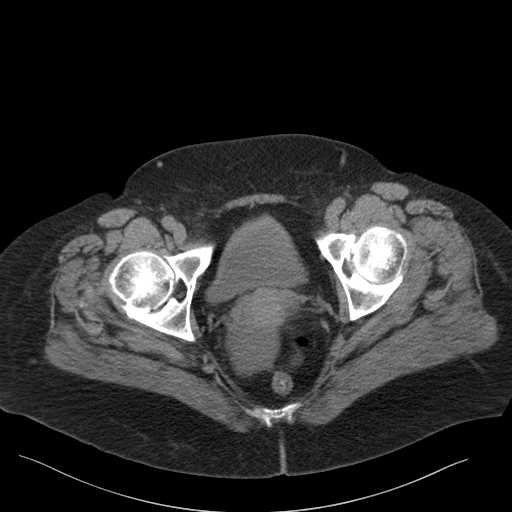
[im 25/104  soft-tissue]
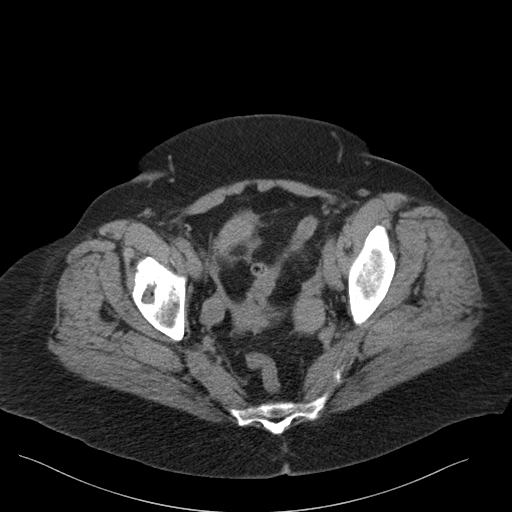
[im 31/104  soft-tissue]
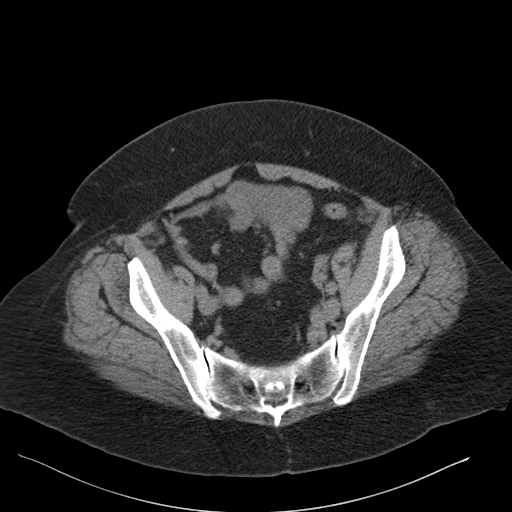
[im 43/104  soft-tissue]
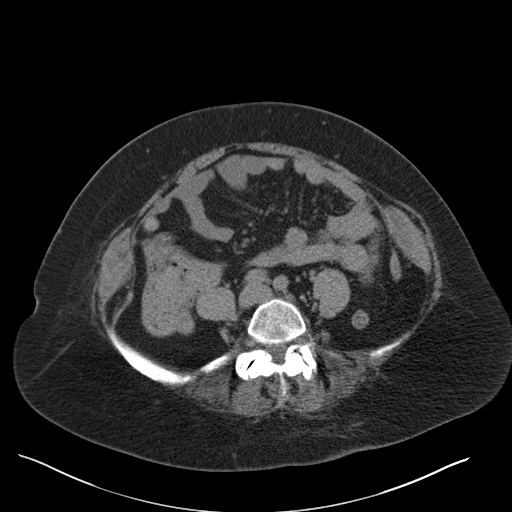
[im 49/104  soft-tissue]
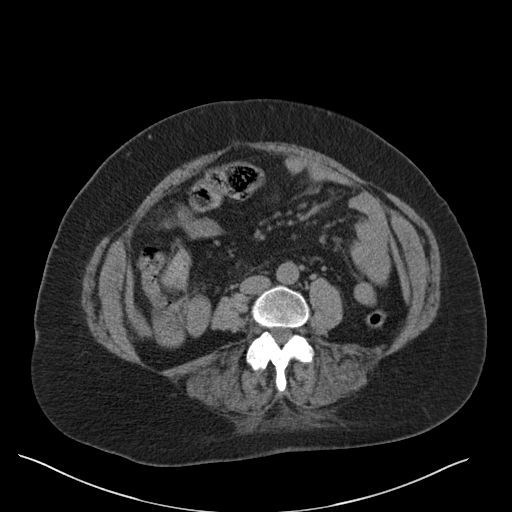
[im 55/104  soft-tissue]
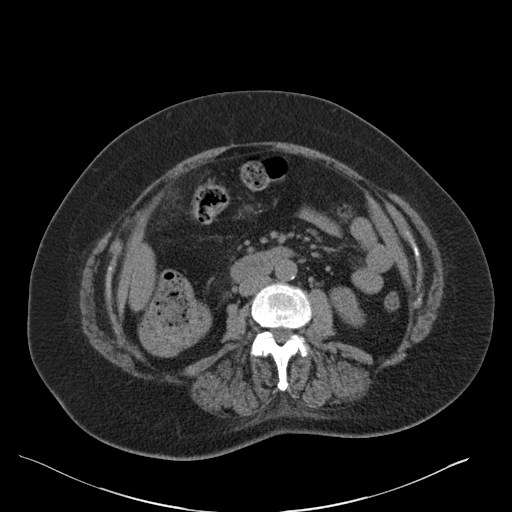
[im 67/104  soft-tissue]
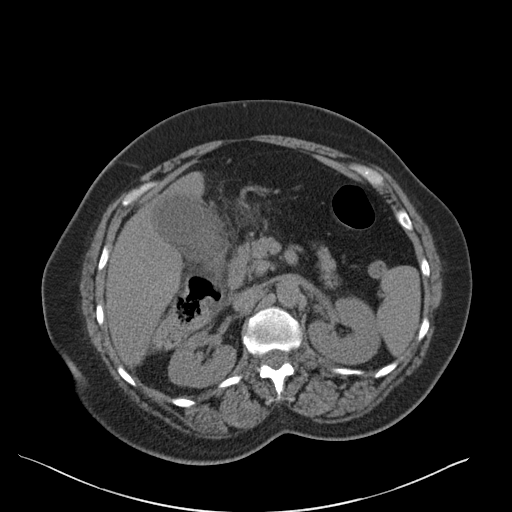
[im 73/104  soft-tissue]
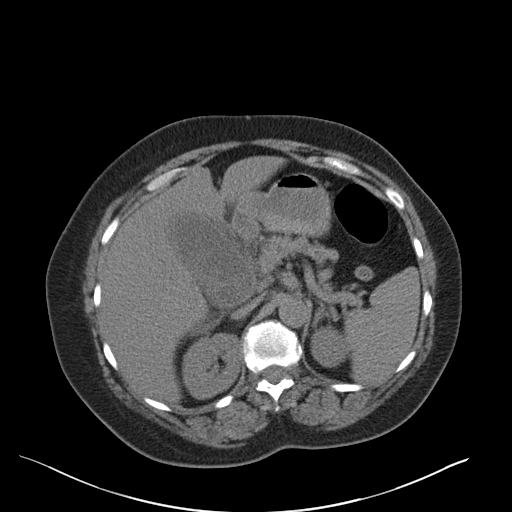
[im 73/104  bone]
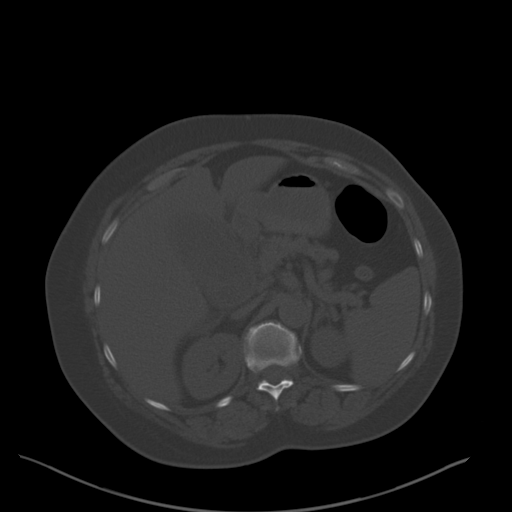
[im 79/104  soft-tissue]
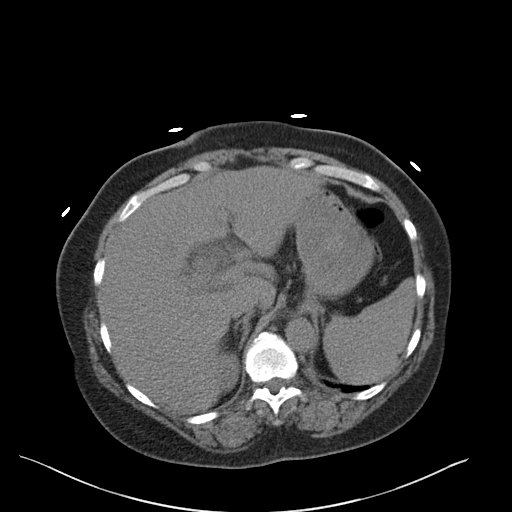
[im 91/104  soft-tissue]
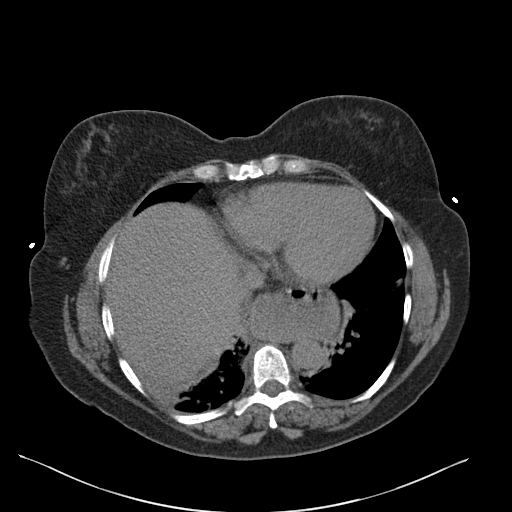
[im 97/104  soft-tissue]
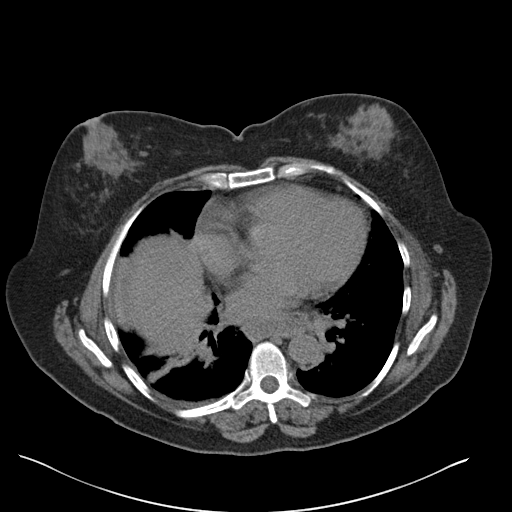

[Series 5: coronal · coronal · 0.81mm/px · 3 of 165 slices shown]
[im 55/165  soft-tissue]
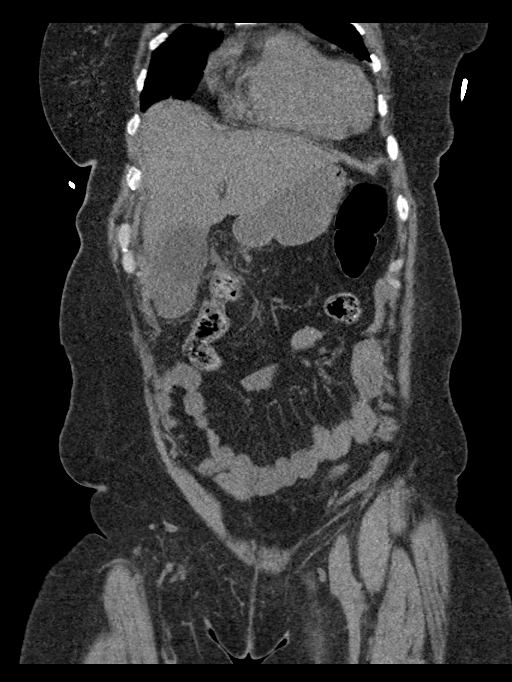
[im 73/165  soft-tissue]
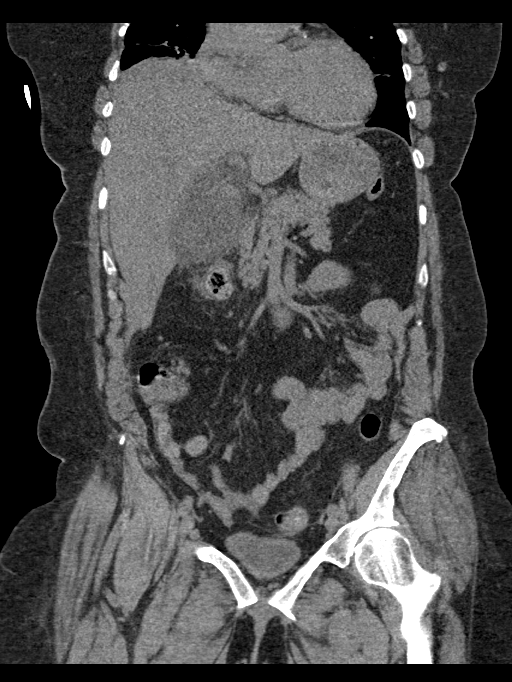
[im 92/165  soft-tissue]
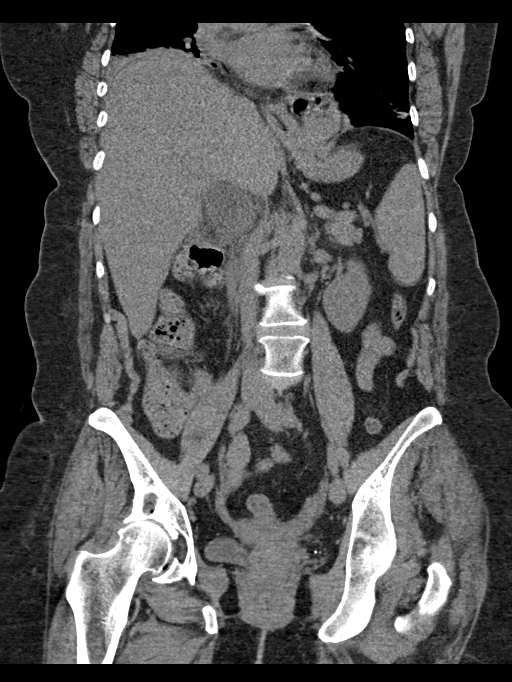

[15 of 46 positions shown; findings below may reference images not displayed]

FINDINGS: Lower chest: The visualized heart size within normal limits. No
pericardial fluid/thickening.

Coronary artery calcifications are seen. There is rounded patchy
airspace consolidation seen at the posterior right lung base with
air bronchograms. There is streaky atelectasis at the left lung
base.

.

Hepatobiliary: Although limited due to the lack of intravenous
contrast, normal in appearance without gross focal abnormality.
There appears to be diffuse fat stranding changes seen around the
gallbladder with layering small stones/sludge. There is also mild
diffuse gallbladder wall thickening.

Pancreas:  Unremarkable.  No surrounding inflammatory changes.

Spleen: Normal in size. Although limited due to the lack of
intravenous contrast, normal in appearance.

Adrenals/Urinary Tract: Both adrenal glands appear normal. The
kidneys and collecting system appear normal without evidence of
urinary tract calculus or hydronephrosis. Bladder is unremarkable.

Stomach/Bowel: The stomach and small bowel are normal in appearance.
There appears to be diffuse wall thickening seen at the hepatic
flexure with small scattered colonic diverticula surrounding
mesenteric fat stranding changes and tiny small lymph nodes present.
No pericolonic loculated fluid collections or free air. The
remainder of the colon is unremarkable.

Vascular/Lymphatic: There are no enlarged abdominal or pelvic lymph
nodes. Scattered minimal aortic atherosclerosis is seen.

Reproductive: The uterus and adnexa are unremarkable.

Other: There is a mild small amount of free fluid seen in the
cul-de-sac.

Musculoskeletal: No acute or significant osseous findings. Moderate
bilateral hip osteoarthritis is seen with superior joint space loss
and cystic changes.
IMPRESSION: Findings suggestive of hepatic flexure diverticulitis. No
surrounding loculated fluid collections or free air.

Wall thickening and inflammatory changes around the gallbladder
which could be due to acute cholecystitis. If further evaluation is
required would recommend dedicated right upper quadrant ultrasound.

Rounded patchy airspace opacity at the right lung base which could
be due to atelectasis and/or early infectious etiology.

Aortic Atherosclerosis ([id number]-D47.7).

## 2021-08-26 IMAGING — US US ABDOMEN LIMITED
1 series · 14 of 25 positions shown · non-contrast
Comparison: None.

CLINICAL DATA: Right upper quadrant pain

EXAM:
ULTRASOUND ABDOMEN LIMITED RIGHT UPPER QUADRANT

[Series 1: us abdomen limited · 14 of 71 slices shown]
[im 1/71]
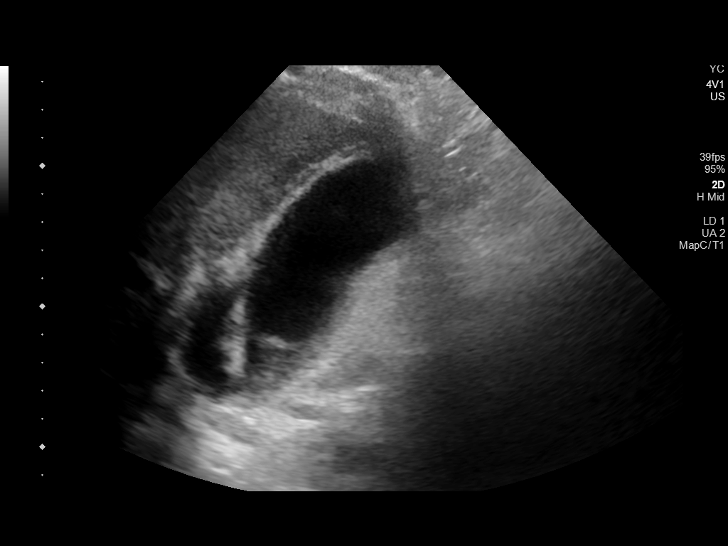
[im 6/71]
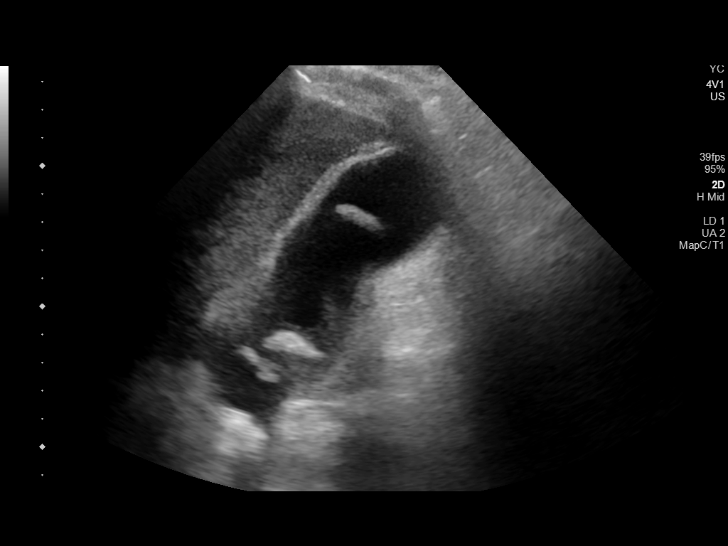
[im 12/71]
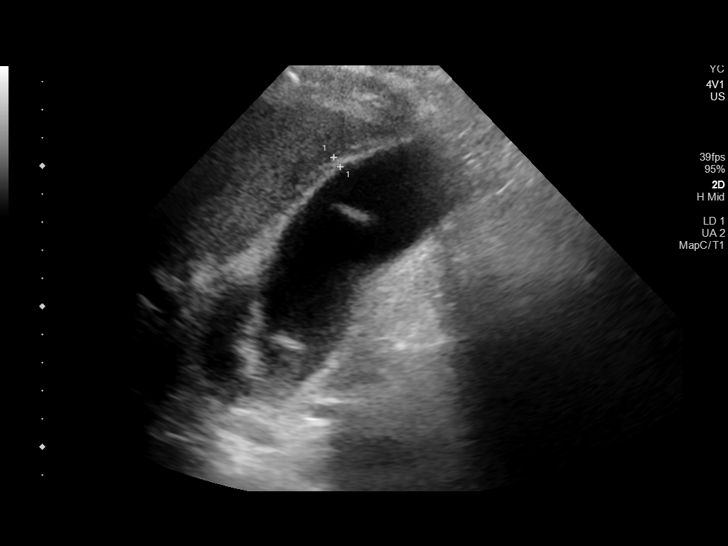
[im 18/71]
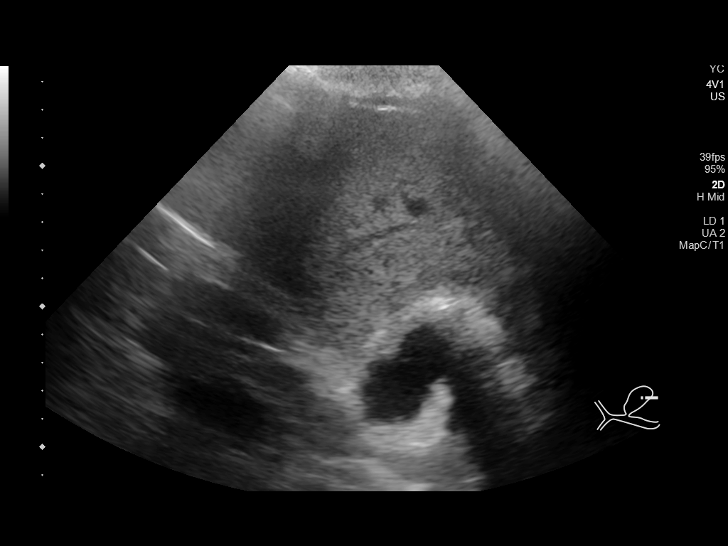
[im 24/71]
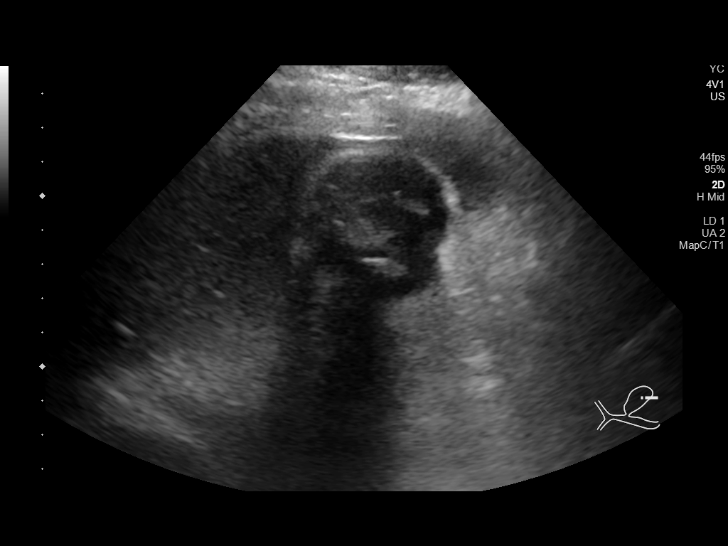
[im 27/71]
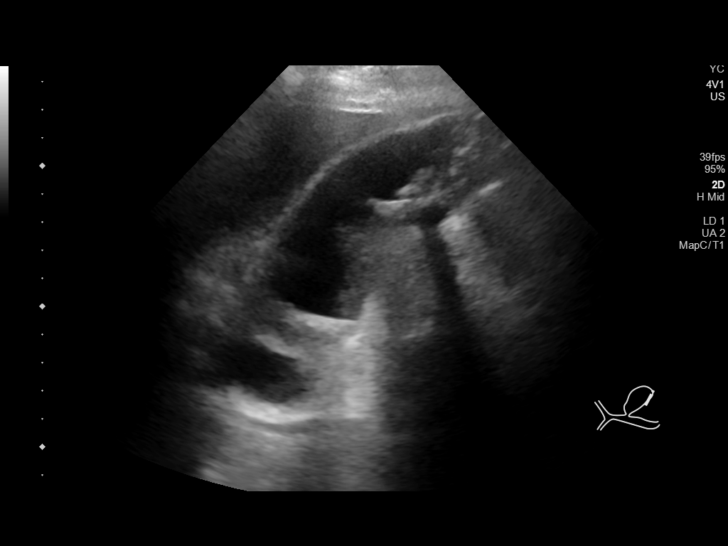
[im 33/71]
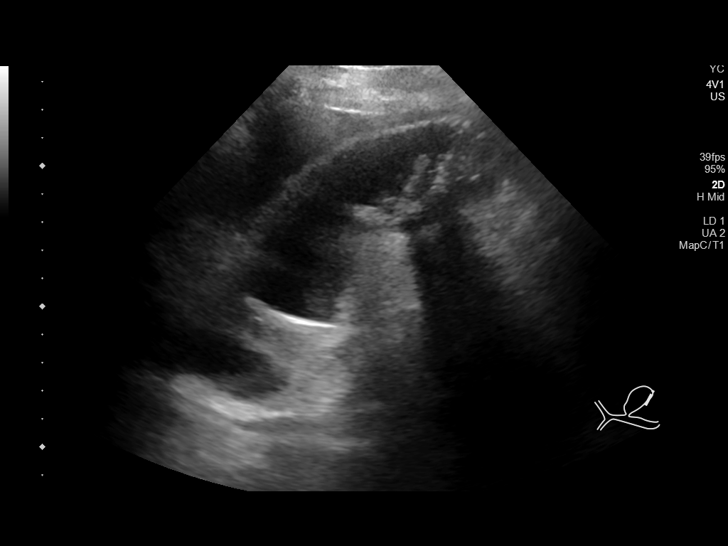
[im 38/71]
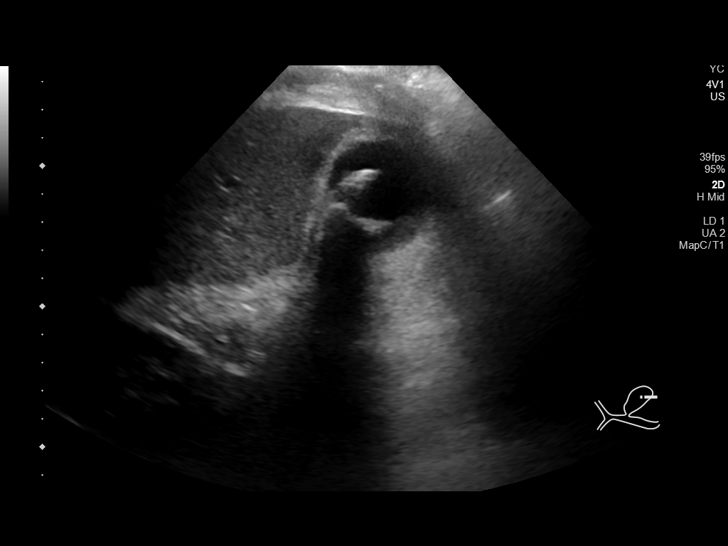
[im 44/71]
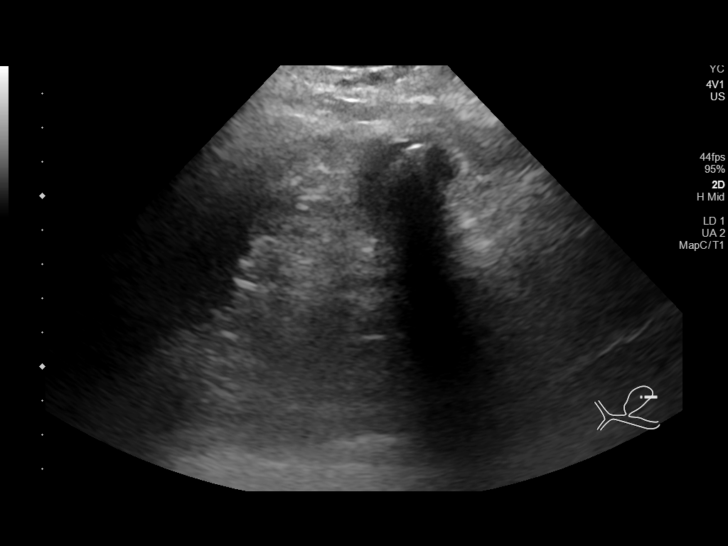
[im 47/71]
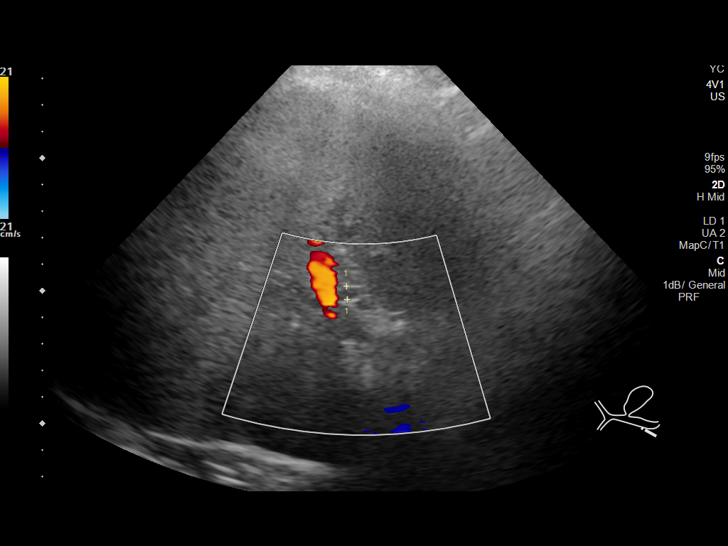
[im 53/71]
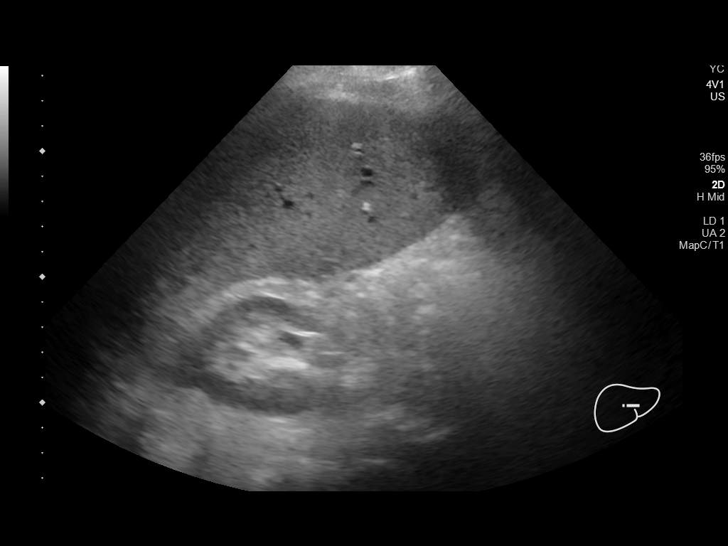
[im 59/71]
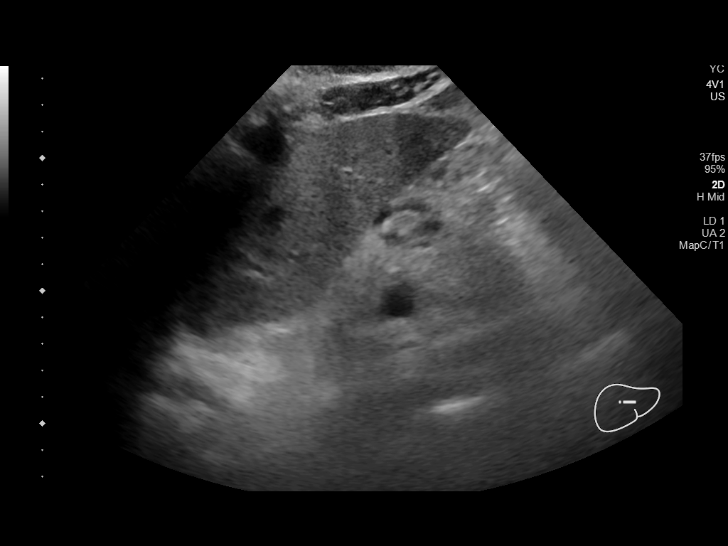
[im 65/71]
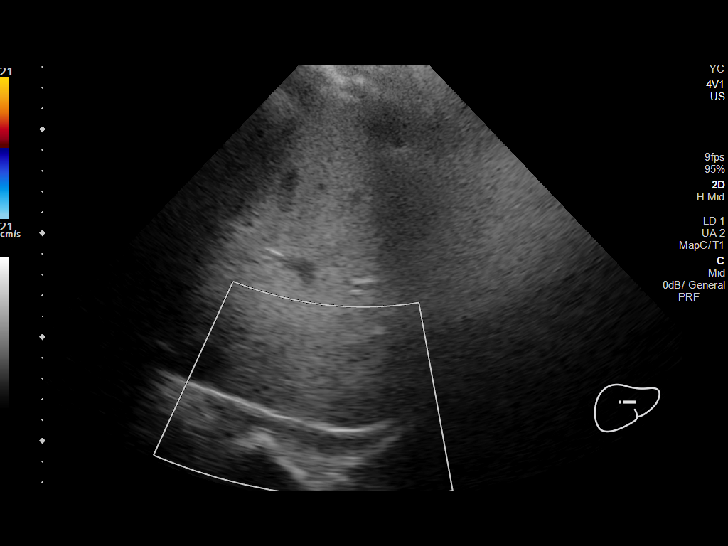
[im 71/71]
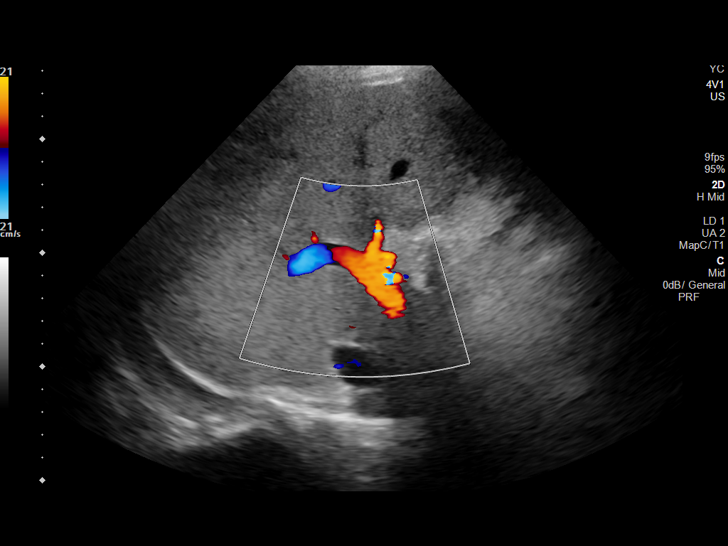

[14 of 25 positions shown; findings below may reference images not displayed]

FINDINGS: Gallbladder:

Gallstones are present measuring up to 2 cm. Mild wall thickening
visualized. No pericholecystic fluid. No sonographic Murphy sign
noted by sonographer.

Common bile duct:

Diameter: Limited visualization with normal diameter of 5 mm

Liver:

No focal lesion identified. Within normal limits in parenchymal
echogenicity. Portal vein is patent on color Doppler imaging with
normal direction of blood flow towards the liver.

Other: Right pleural effusion.
IMPRESSION: Cholelithiasis and mild gallbladder wall thickening. No additional
sonographic evidence of acute cholecystitis.

## 2021-08-26 IMAGING — CR DG CHEST 2V
2 series · 2 of 2 positions shown · non-contrast
Comparison: None.

CLINICAL DATA: History of diverticulitis.

EXAM:
CHEST - 2 VIEW

[w chest pa]
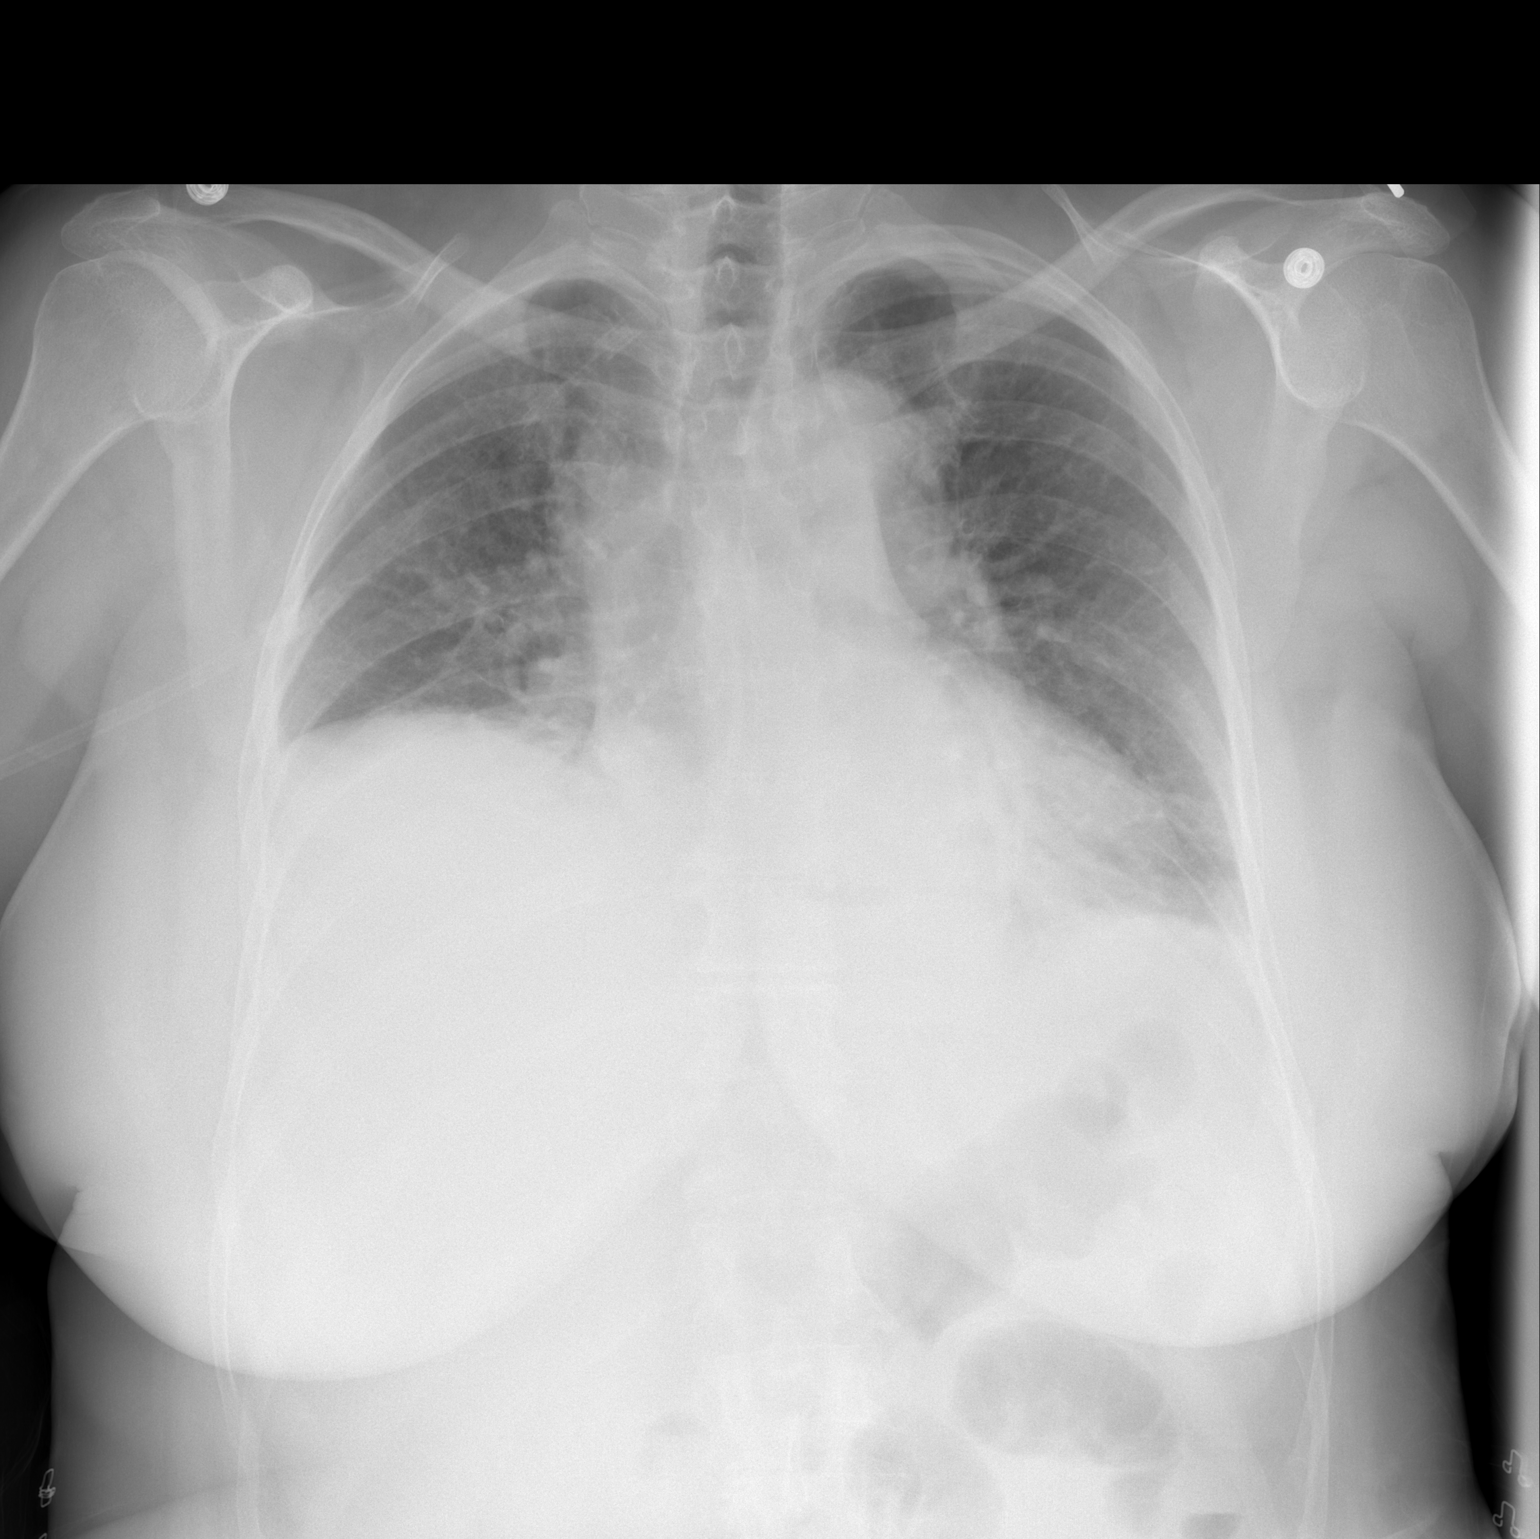

[w chest lat]
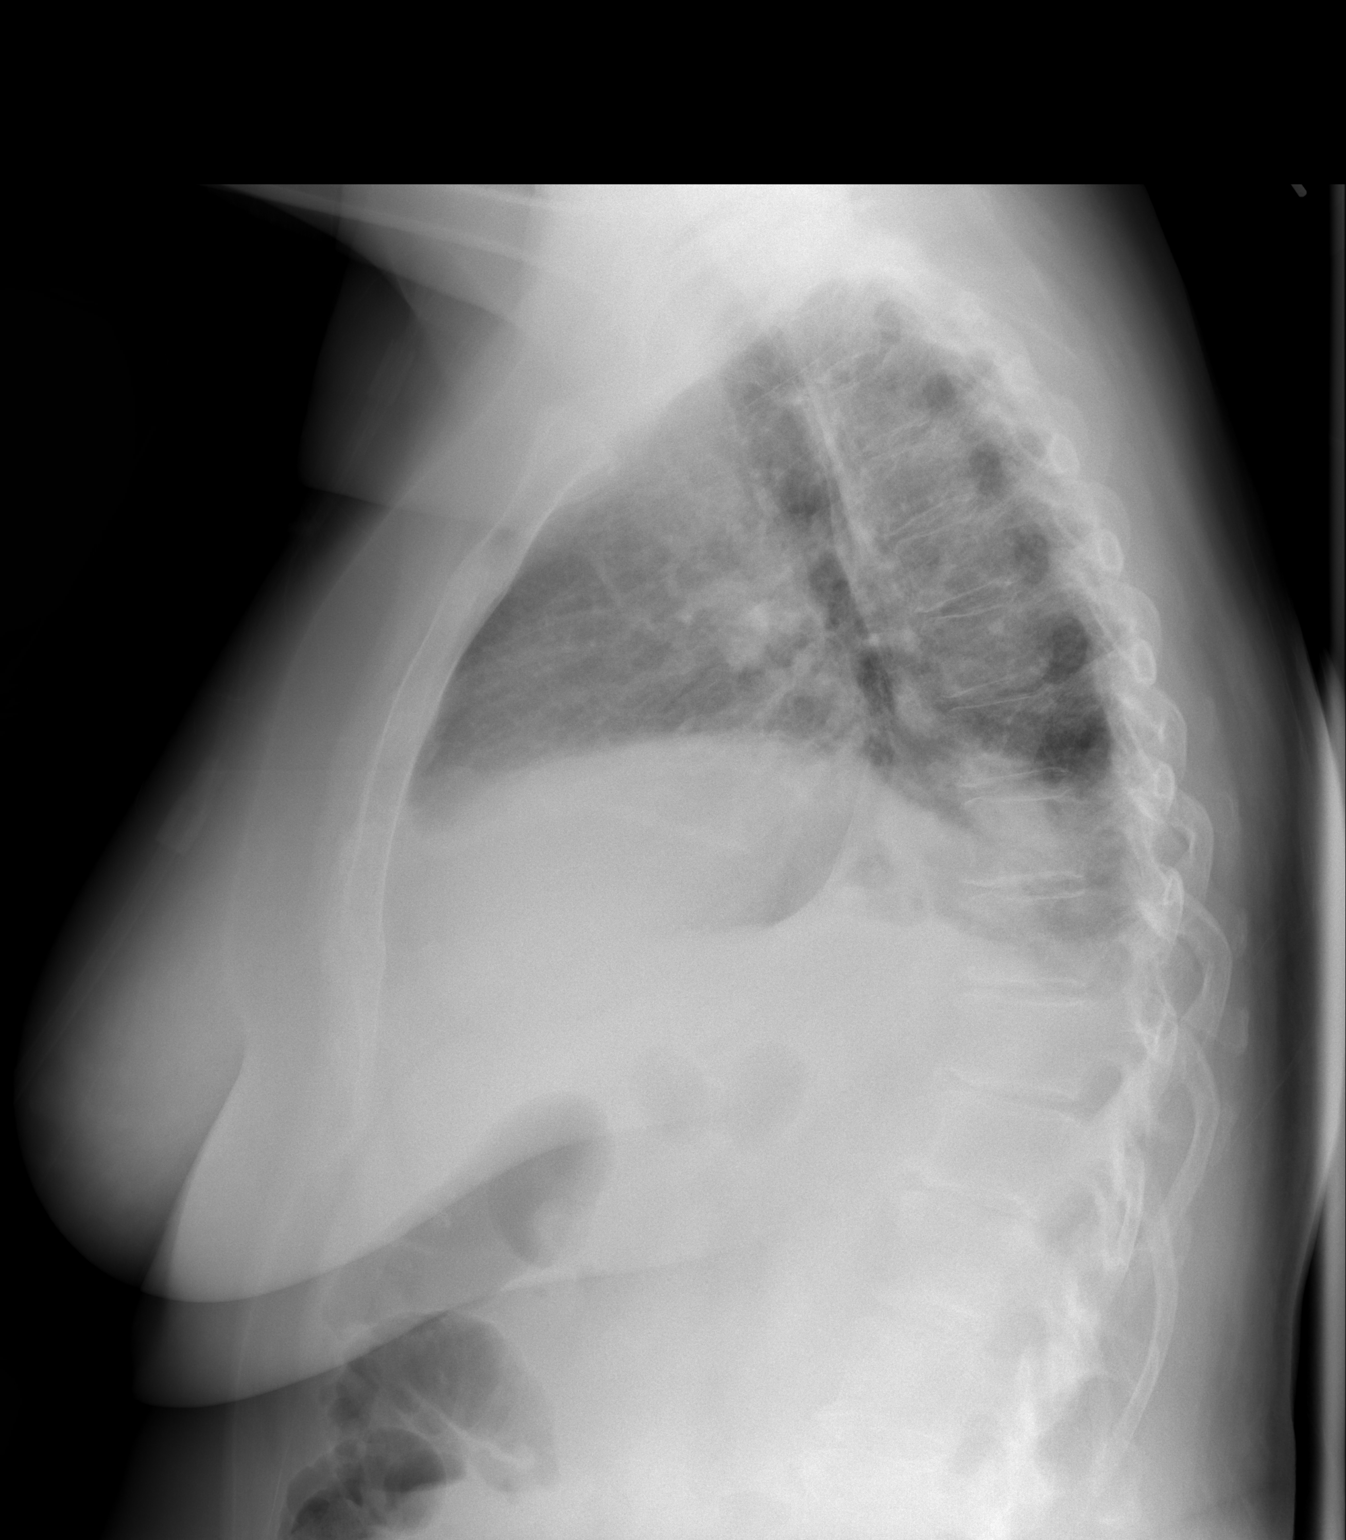

[2 of 2 positions shown; findings below may reference images not displayed]

FINDINGS: Midline trachea. Normal heart size for level of inspiration.
Moderate right hemidiaphragm elevation. No pleural effusion or
pneumothorax. Bibasilar airspace disease.
IMPRESSION: Bibasilar airspace disease. Especially given the elevated right
hemidiaphragm, the right-sided opacity is most likely atelectasis.
Left base opacity could represent atelectasis or pneumonia. Consider
radiographic follow-up.

## 2024-08-21 NOTE — Progress Notes (Signed)
 Surgery orders requested via Epic inbox.

## 2024-08-25 ENCOUNTER — Ambulatory Visit: Payer: Self-pay | Admitting: Student

## 2024-08-25 NOTE — Progress Notes (Signed)
 Please place orders for PAT appointment scheduled 08/28/24.

## 2024-08-28 ENCOUNTER — Encounter (HOSPITAL_COMMUNITY): Admission: RE | Admit: 2024-08-28

## 2024-09-07 ENCOUNTER — Ambulatory Visit (HOSPITAL_COMMUNITY): Admit: 2024-09-07 | Payer: Self-pay | Admitting: Orthopedic Surgery
